# Patient Record
Sex: Female | Born: 1960 | Race: White | Hispanic: No | Marital: Married | Smoking: Former smoker
Health system: Southern US, Community
[De-identification: ages and names within clinical notes are randomized; demographics above are authoritative.]

## PROBLEM LIST (undated history)

## (undated) DIAGNOSIS — I10 Essential (primary) hypertension: Secondary | ICD-10-CM

## (undated) DIAGNOSIS — M8580 Other specified disorders of bone density and structure, unspecified site: Secondary | ICD-10-CM

## (undated) DIAGNOSIS — K219 Gastro-esophageal reflux disease without esophagitis: Secondary | ICD-10-CM

## (undated) HISTORY — PX: BREAST SURGERY: SHX581

## (undated) HISTORY — PX: DILATION AND CURETTAGE OF UTERUS: SHX78

## (undated) HISTORY — DX: Essential (primary) hypertension: I10

## (undated) HISTORY — DX: Gastro-esophageal reflux disease without esophagitis: K21.9

## (undated) HISTORY — DX: Other specified disorders of bone density and structure, unspecified site: M85.80

## (undated) HISTORY — PX: TUBAL LIGATION: SHX77

---

## 1998-12-05 ENCOUNTER — Other Ambulatory Visit: Admission: RE | Admit: 1998-12-05 | Discharge: 1998-12-05 | Payer: Self-pay | Admitting: Gynecology

## 1999-03-17 ENCOUNTER — Ambulatory Visit (HOSPITAL_COMMUNITY): Admission: RE | Admit: 1999-03-17 | Discharge: 1999-03-17 | Payer: Self-pay | Admitting: Gynecology

## 1999-12-13 ENCOUNTER — Other Ambulatory Visit: Admission: RE | Admit: 1999-12-13 | Discharge: 1999-12-13 | Payer: Self-pay | Admitting: Gynecology

## 2000-12-24 ENCOUNTER — Other Ambulatory Visit: Admission: RE | Admit: 2000-12-24 | Discharge: 2000-12-24 | Payer: Self-pay | Admitting: Gynecology

## 2002-04-08 ENCOUNTER — Other Ambulatory Visit: Admission: RE | Admit: 2002-04-08 | Discharge: 2002-04-08 | Payer: Self-pay | Admitting: Gynecology

## 2003-09-22 ENCOUNTER — Other Ambulatory Visit: Admission: RE | Admit: 2003-09-22 | Discharge: 2003-09-22 | Payer: Self-pay | Admitting: Gynecology

## 2003-10-18 ENCOUNTER — Ambulatory Visit (HOSPITAL_COMMUNITY): Admission: RE | Admit: 2003-10-18 | Discharge: 2003-10-18 | Payer: Self-pay | Admitting: *Deleted

## 2003-10-18 ENCOUNTER — Ambulatory Visit (HOSPITAL_BASED_OUTPATIENT_CLINIC_OR_DEPARTMENT_OTHER): Admission: RE | Admit: 2003-10-18 | Discharge: 2003-10-18 | Payer: Self-pay | Admitting: *Deleted

## 2003-10-18 ENCOUNTER — Encounter: Admission: RE | Admit: 2003-10-18 | Discharge: 2003-10-18 | Payer: Self-pay | Admitting: *Deleted

## 2003-10-18 ENCOUNTER — Encounter (INDEPENDENT_AMBULATORY_CARE_PROVIDER_SITE_OTHER): Payer: Self-pay | Admitting: *Deleted

## 2004-09-22 ENCOUNTER — Other Ambulatory Visit: Admission: RE | Admit: 2004-09-22 | Discharge: 2004-09-22 | Payer: Self-pay | Admitting: Gynecology

## 2005-05-23 ENCOUNTER — Emergency Department (HOSPITAL_COMMUNITY): Admission: EM | Admit: 2005-05-23 | Discharge: 2005-05-24 | Payer: Self-pay | Admitting: Emergency Medicine

## 2005-09-25 ENCOUNTER — Other Ambulatory Visit: Admission: RE | Admit: 2005-09-25 | Discharge: 2005-09-25 | Payer: Self-pay | Admitting: Gynecology

## 2006-10-01 ENCOUNTER — Other Ambulatory Visit: Admission: RE | Admit: 2006-10-01 | Discharge: 2006-10-01 | Payer: Self-pay | Admitting: Gynecology

## 2006-10-05 IMAGING — CR DG SHOULDER 2+V*L*
3 series · 3 of 3 positions shown · non-contrast
Comparison: None.   
 LEFT SHOULDER ? 3 VIEW:

CLINICAL DATA: Fall with left shoulder pain and injury.

[w shoulder ap internal left]
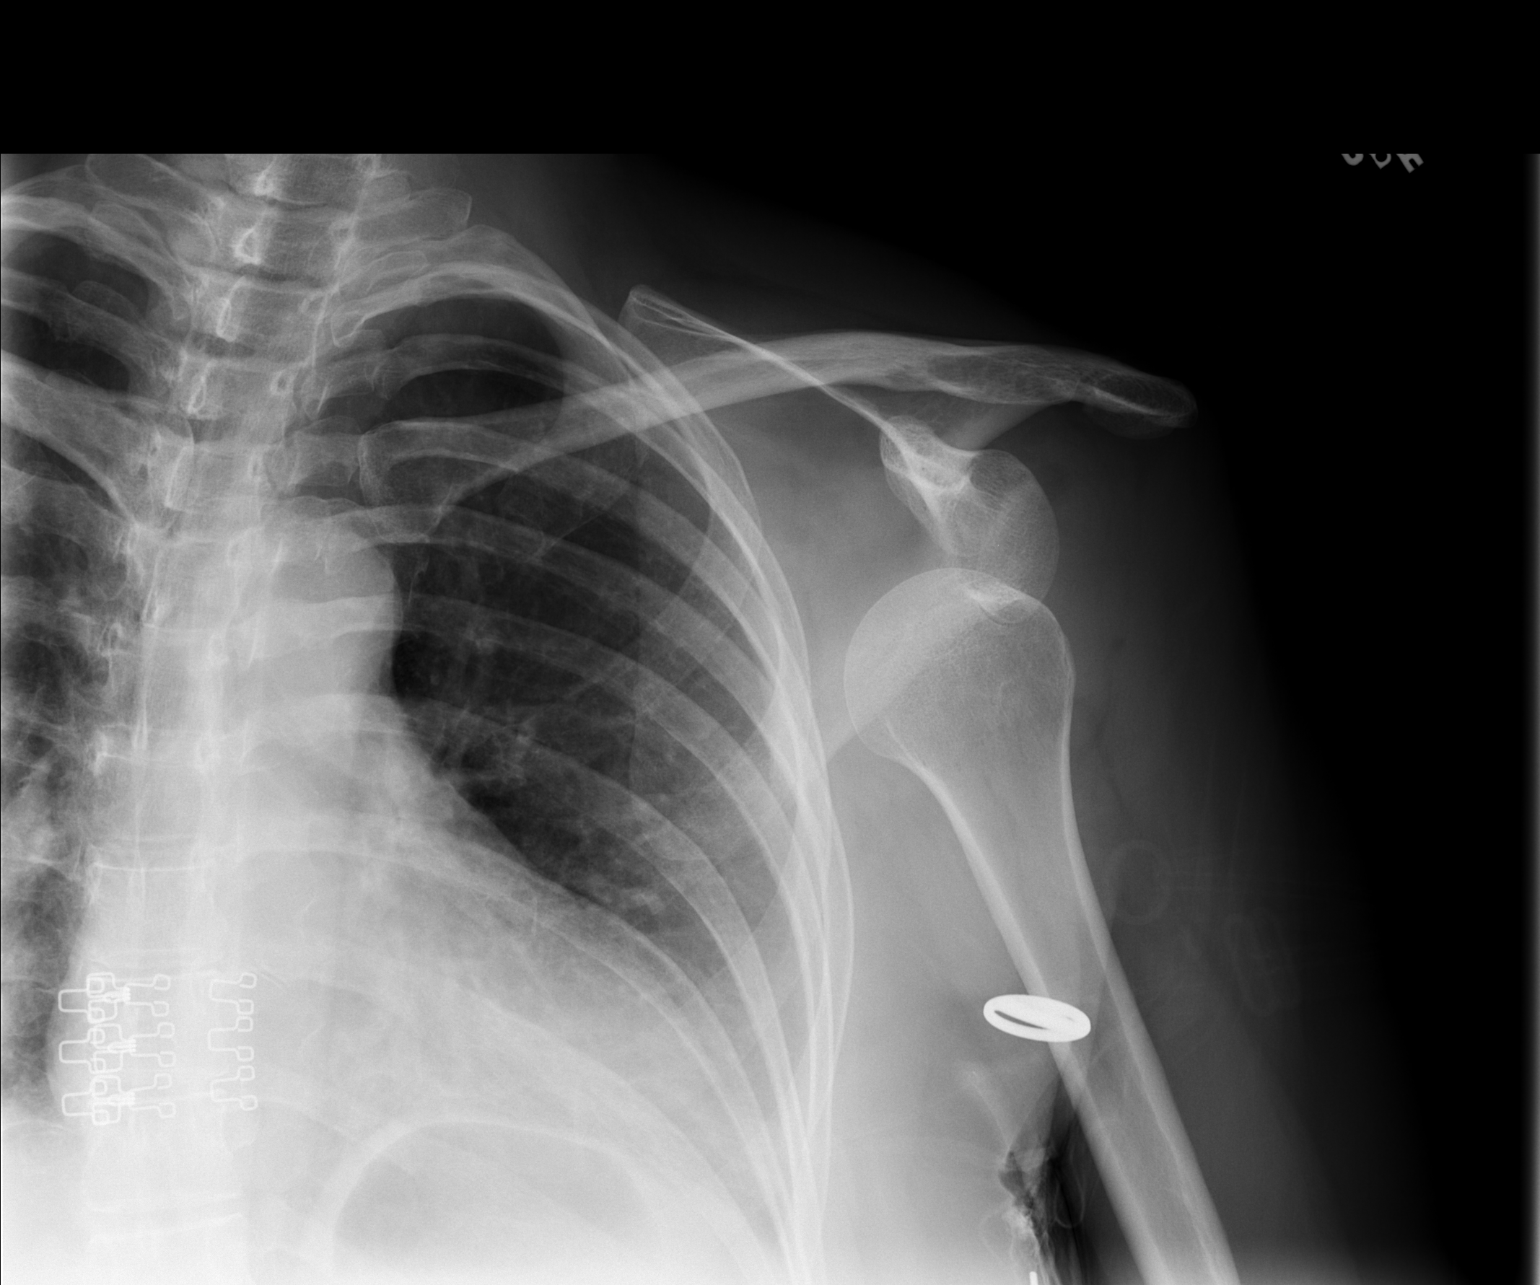

[w shoulder ap external left]
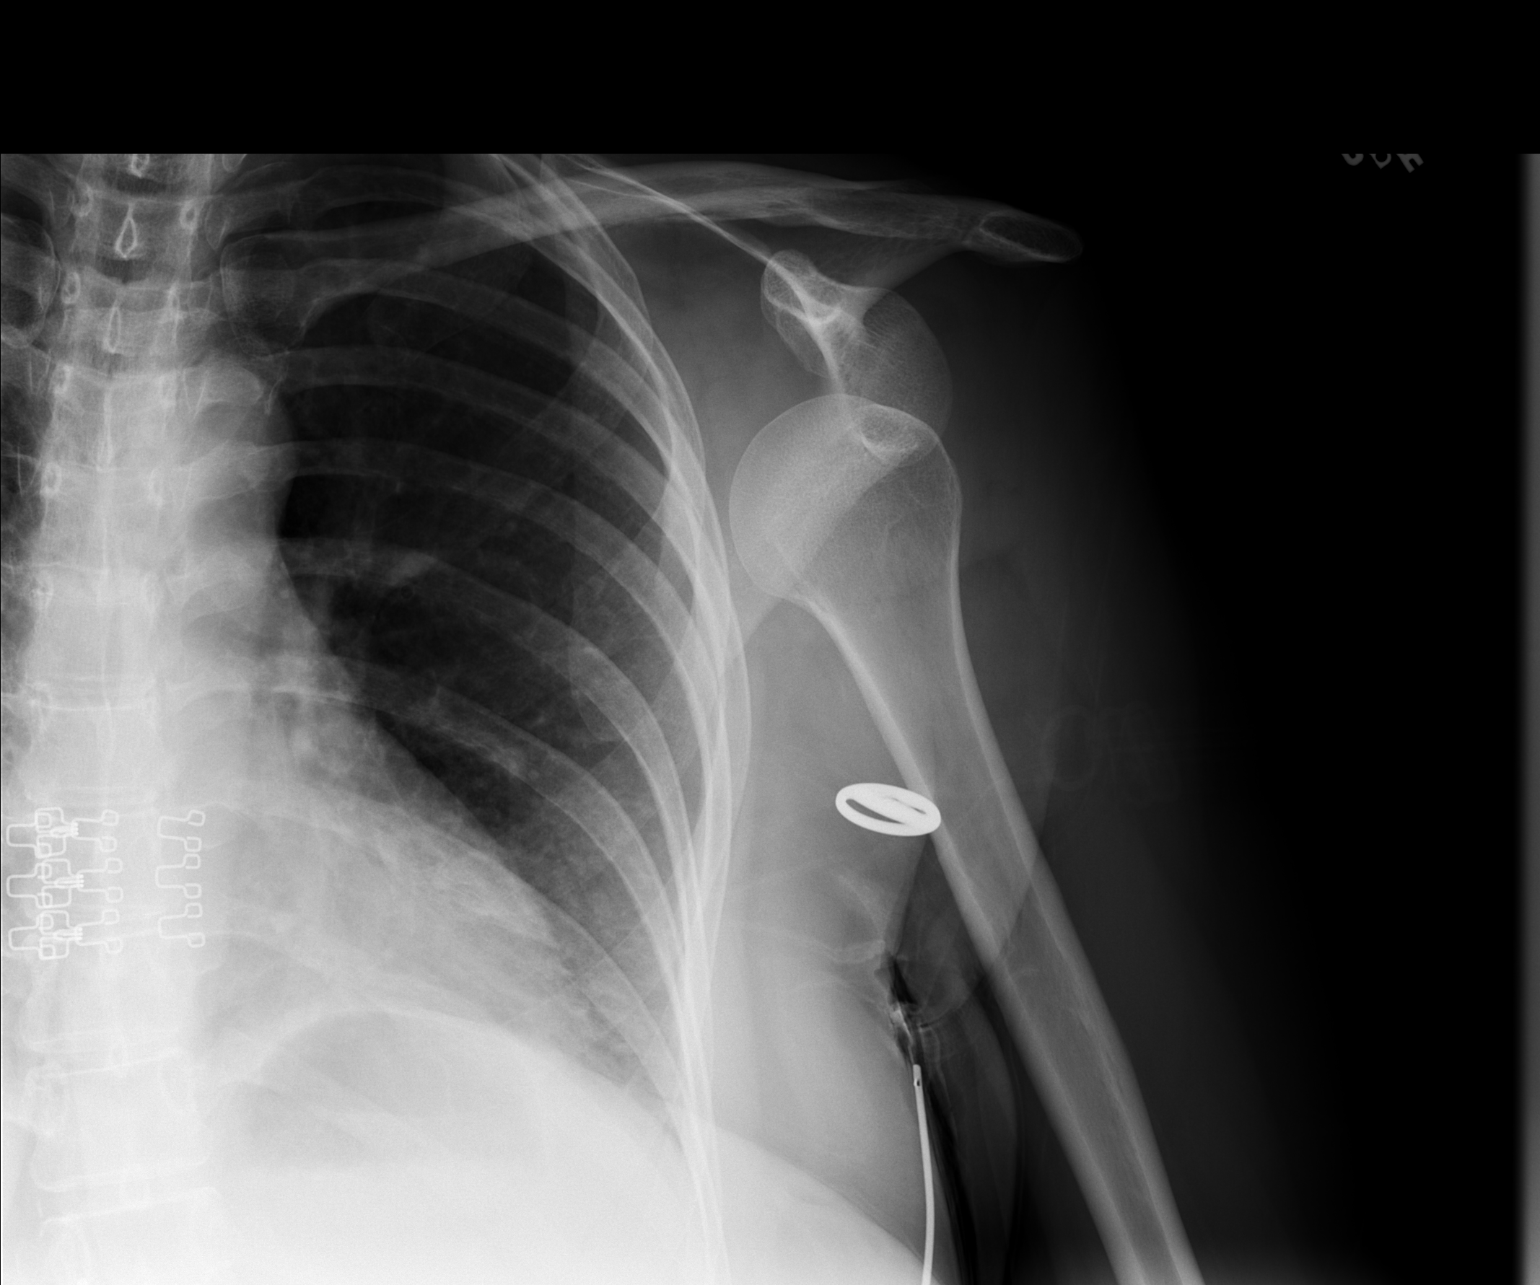

[w shoulder y view left]
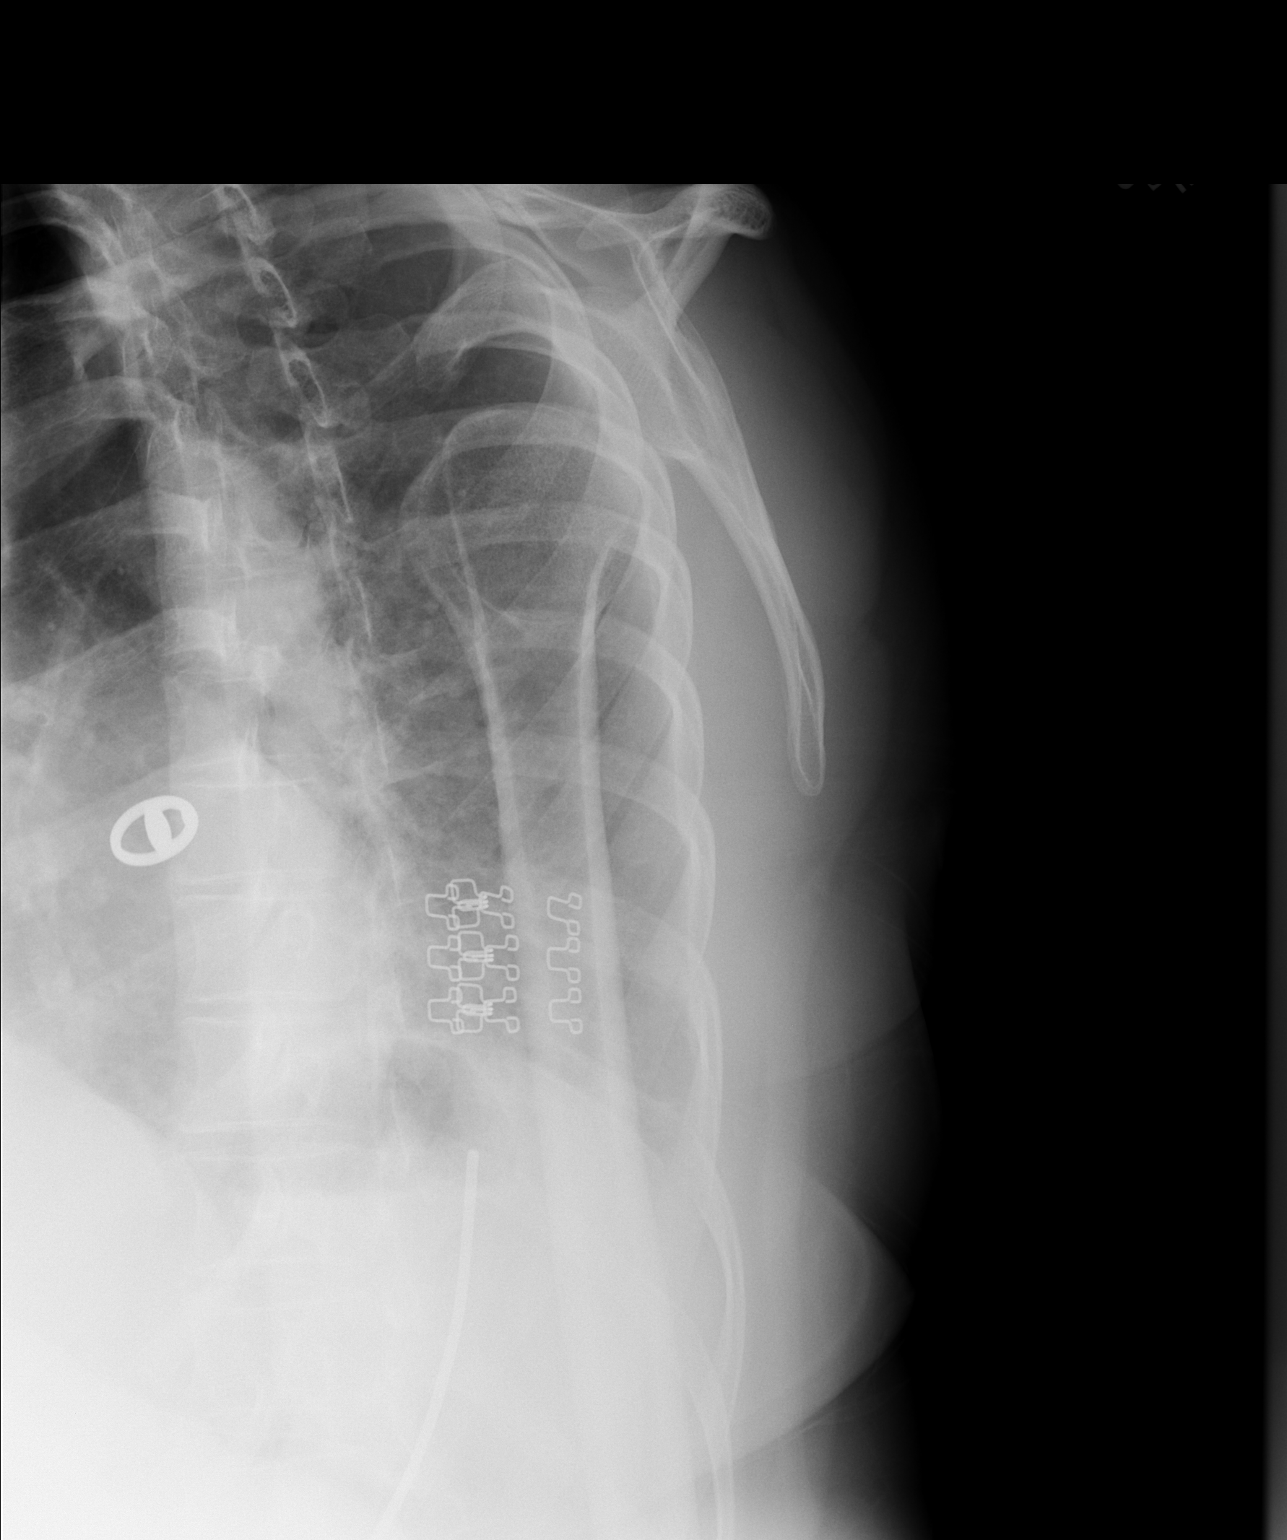

[3 of 3 positions shown; findings below may reference images not displayed]

FINDINGS: The humeral head is anteriorly dislocated.  No associated bony fracture is apparent.  No convincing evidence for [place] deformity is noted.
IMPRESSION: Anterior left humeral head dislocation.

## 2007-10-02 ENCOUNTER — Other Ambulatory Visit: Admission: RE | Admit: 2007-10-02 | Discharge: 2007-10-02 | Payer: Self-pay | Admitting: Gynecology

## 2008-10-18 ENCOUNTER — Ambulatory Visit: Payer: Self-pay | Admitting: Gynecology

## 2008-10-18 ENCOUNTER — Other Ambulatory Visit: Admission: RE | Admit: 2008-10-18 | Discharge: 2008-10-18 | Payer: Self-pay | Admitting: Gynecology

## 2008-10-18 ENCOUNTER — Encounter: Payer: Self-pay | Admitting: Gynecology

## 2008-10-26 ENCOUNTER — Ambulatory Visit: Payer: Self-pay | Admitting: Gynecology

## 2009-11-25 ENCOUNTER — Ambulatory Visit: Payer: Self-pay | Admitting: Gynecology

## 2009-11-25 ENCOUNTER — Other Ambulatory Visit: Admission: RE | Admit: 2009-11-25 | Discharge: 2009-11-25 | Payer: Self-pay | Admitting: Gynecology

## 2010-02-01 ENCOUNTER — Encounter: Admission: RE | Admit: 2010-02-01 | Discharge: 2010-02-01 | Payer: Self-pay | Admitting: Cardiology

## 2010-10-13 NOTE — Op Note (Signed)
NAME:  Alexis Juarez, FORSTER [facility]                        ACCOUNT NO.:  [id number]   MEDICAL RECORD NO.:  [id number]                   PATIENT TYPE:  AMB   LOCATION:  DSC                                  FACILITY:  [facility]   PHYSICIAN:  Vikki Ports, M.D.         DATE OF BIRTH:  1960/11/23   DATE OF PROCEDURE:  10/18/2003  DATE OF DISCHARGE:                                 OPERATIVE REPORT   PREOPERATIVE DIAGNOSIS:  Abnormal right mammogram.   POSTOPERATIVE DIAGNOSIS:  Abnormal right mammogram.   PROCEDURE:  Needle-localization right breast biopsy.   SURGEON:  Vikki Ports, M.D.   ANESTHESIA:  MAC.   DESCRIPTION OF PROCEDURE:  The patient was taken to the operating room and  placed in a supine position.  After adequate MAC anesthesia was induced, the  right breast was prepped and draped in normal sterile fashion.  Using 1%  lidocaine local anesthesia, the skin overlying the wire was anesthetized.  A  curvilinear incision was made, dissected down along the wire, ran toward the  tip, and took all the tissue surrounding it.  There was a small nodule  within this specimen.  Specimen mammography verified the presence of the  nodule.  Adequate hemostasis was assured and the skin was closed with  subcuticular 4-0 Monocryl, Steri-Strips, sterile dressing were applied.  The  patient tolerated the procedure well and went to PACU in good condition.                                               Vikki Ports, M.D.    KRH/MEDQ  D:  10/18/2003  T:  10/19/2003  Job:  [id number]

## 2011-03-16 ENCOUNTER — Encounter: Payer: Self-pay | Admitting: Gynecology

## 2011-03-22 ENCOUNTER — Ambulatory Visit (INDEPENDENT_AMBULATORY_CARE_PROVIDER_SITE_OTHER): Payer: BC Managed Care – PPO | Admitting: Gynecology

## 2011-03-22 ENCOUNTER — Encounter: Payer: Self-pay | Admitting: Gynecology

## 2011-03-22 VITALS — BP 124/88 | Ht 63.25 in | Wt 148.0 lb

## 2011-03-22 DIAGNOSIS — B373 Candidiasis of vulva and vagina: Secondary | ICD-10-CM

## 2011-03-22 DIAGNOSIS — B3731 Acute candidiasis of vulva and vagina: Secondary | ICD-10-CM

## 2011-03-22 DIAGNOSIS — M899 Disorder of bone, unspecified: Secondary | ICD-10-CM

## 2011-03-22 DIAGNOSIS — Z131 Encounter for screening for diabetes mellitus: Secondary | ICD-10-CM

## 2011-03-22 DIAGNOSIS — Z13220 Encounter for screening for lipoid disorders: Secondary | ICD-10-CM

## 2011-03-22 DIAGNOSIS — F419 Anxiety disorder, unspecified: Secondary | ICD-10-CM

## 2011-03-22 DIAGNOSIS — R823 Hemoglobinuria: Secondary | ICD-10-CM

## 2011-03-22 DIAGNOSIS — F411 Generalized anxiety disorder: Secondary | ICD-10-CM

## 2011-03-22 DIAGNOSIS — R300 Dysuria: Secondary | ICD-10-CM

## 2011-03-22 DIAGNOSIS — M8580 Other specified disorders of bone density and structure, unspecified site: Secondary | ICD-10-CM

## 2011-03-22 DIAGNOSIS — IMO0002 Reserved for concepts with insufficient information to code with codable children: Secondary | ICD-10-CM

## 2011-03-22 DIAGNOSIS — Z23 Encounter for immunization: Secondary | ICD-10-CM

## 2011-03-22 DIAGNOSIS — Z01419 Encounter for gynecological examination (general) (routine) without abnormal findings: Secondary | ICD-10-CM

## 2011-03-22 MED ORDER — ALPRAZOLAM 0.25 MG PO TABS: 0.2500 mg | ORAL_TABLET | Freq: Every evening | ORAL | Status: AC | PRN

## 2011-03-22 MED ORDER — FLUCONAZOLE 150 MG PO TABS: 150.0000 mg | ORAL_TABLET | Freq: Once | ORAL | Status: AC

## 2011-03-22 NOTE — Progress Notes (Signed)
Alexis Juarez 1961/03/06 [id number]        50 y.o.  for annual exam.  Overall doing well. She does note some discomfort with intercourse and some dysuria following intercourse but not in between. Having some hot flashes and sweats but these seem to be getting better. She is amenorrheic times several years.  History of osteopenia due for repeat bone density now.  Past medical history,surgical history, medications, allergies, family history and social history were all reviewed and documented in the EPIC chart. ROS:  Was performed and pertinent positives and negatives are included in the history.  Exam: chaperone present Filed Vitals:   03/22/11 1202  BP: 124/88   General appearance  Normal Skin grossly normal Head/Neck normal with no cervical or supraclavicular adenopathy thyroid normal Lungs  clear Cardiac RR, without RMG Abdominal  soft, nontender, without masses, organomegaly or hernia Breasts  examined lying and sitting without masses, retractions, discharge or axillary adenopathy. Pelvic  Ext/BUS/vagina  normal with mild atrophic changes noted, wet prep done  Cervix  normal    Uterus  anteverted, normal size, shape and contour, midline and mobile nontender   Adnexa  Without masses or tenderness    Anus and perineum  normal   Rectovaginal  normal sphincter tone without palpated masses or tenderness.    Assessment/Plan:  50 y.o. female for annual exam.    1. Dyspareunia, coital dysuria, wet prep positive for yeast. Discussed with patient we'll treat the yeast with Diflucan 150x1 dose. I think that her symptoms are probably also atrophy related. Options to include lubricants, moisturizers such as Replens, HRT including Vagifem were all reviewed. Patient also tried lubricants first see how she does and she'll follow up if there are continued issues. 2. Osteopenia. Patient has history of osteopenia with DEXA June 2010 showing a -2.0 at the AP spine. She'll repeat her DEXA now also  check a vitamin D level she'll follow up for these results. 3. Anxiety. Patient has some mild anxiety involving caring for her aging mother. She asked for refill of Xanax 0.25 #30 refill x1 the she uses when necessary. 4. Health maintenance. SBE monthly reviewed. Had mammogram this month which was normal. She will continue with annual mammography. Recommended screening colonoscopy she agrees to arrange this. She has no history of significant abnormal Pap smears with a number of normal in a row in her chart the last in 2011.  Current screening guidelines were reviewed and she agrees with less frequent screening interval. No Pap was done this year we'll plan every 3 years. Baseline labs to include CBC urinalysis glucose lipid profile were ordered. She continues to smoke despite attempts to help her [email or web address] elevated blood pressure noted with diastolic at 88 MS and have her rechecked in the exam situation she agrees to do so.    Dara Lords MD, 12:44 PM 03/22/2011

## 2011-03-23 ENCOUNTER — Other Ambulatory Visit: Payer: Self-pay | Admitting: *Deleted

## 2011-03-23 DIAGNOSIS — E7800 Pure hypercholesterolemia, unspecified: Secondary | ICD-10-CM

## 2011-03-23 LAB — VITAMIN D 25 HYDROXY (VIT D DEFICIENCY, FRACTURES): Vit D, 25-Hydroxy: 45 ng/mL (ref 30–89)

## 2011-03-23 MED ORDER — SULFAMETHOXAZOLE-TMP DS 800-160 MG PO TABS: 1.0000 | ORAL_TABLET | Freq: Two times a day (BID) | ORAL | Status: AC

## 2011-03-23 NOTE — Progress Notes (Signed)
Addended by: Dara Lords on: 03/23/2011 08:49 AM   Modules accepted: Orders

## 2011-04-12 ENCOUNTER — Other Ambulatory Visit (INDEPENDENT_AMBULATORY_CARE_PROVIDER_SITE_OTHER): Payer: BC Managed Care – PPO | Admitting: *Deleted

## 2011-04-12 ENCOUNTER — Telehealth: Payer: Self-pay | Admitting: Gynecology

## 2011-04-12 ENCOUNTER — Ambulatory Visit (INDEPENDENT_AMBULATORY_CARE_PROVIDER_SITE_OTHER): Payer: BC Managed Care – PPO | Admitting: *Deleted

## 2011-04-12 DIAGNOSIS — M899 Disorder of bone, unspecified: Secondary | ICD-10-CM

## 2011-04-12 DIAGNOSIS — M8580 Other specified disorders of bone density and structure, unspecified site: Secondary | ICD-10-CM

## 2011-04-12 DIAGNOSIS — M898X9 Other specified disorders of bone, unspecified site: Secondary | ICD-10-CM

## 2011-04-12 DIAGNOSIS — M948X9 Other specified disorders of cartilage, unspecified sites: Secondary | ICD-10-CM

## 2011-04-12 DIAGNOSIS — E7800 Pure hypercholesterolemia, unspecified: Secondary | ICD-10-CM

## 2011-04-12 NOTE — Progress Notes (Signed)
Pt informed with the below note, recall letter in computer. 

## 2011-04-12 NOTE — Telephone Encounter (Signed)
Tell patient that bone density does show some loss and I want to check a TSH, PTH and comprehensive metabolic panel. Her vitamin D level was good. Recommend office visit after blood work is done so we can discuss her bone density.

## 2011-04-16 NOTE — Telephone Encounter (Signed)
Pt informed with the below note. 

## 2011-04-16 NOTE — Telephone Encounter (Signed)
Lm for pt to call

## 2011-06-16 IMAGING — CR DG CHEST 2V
2 series · 2 of 2 positions shown · non-contrast
Comparison: None.

CLINICAL DATA: Chest pain

CHEST - 2 VIEW

[view not recorded (1 of 2)]
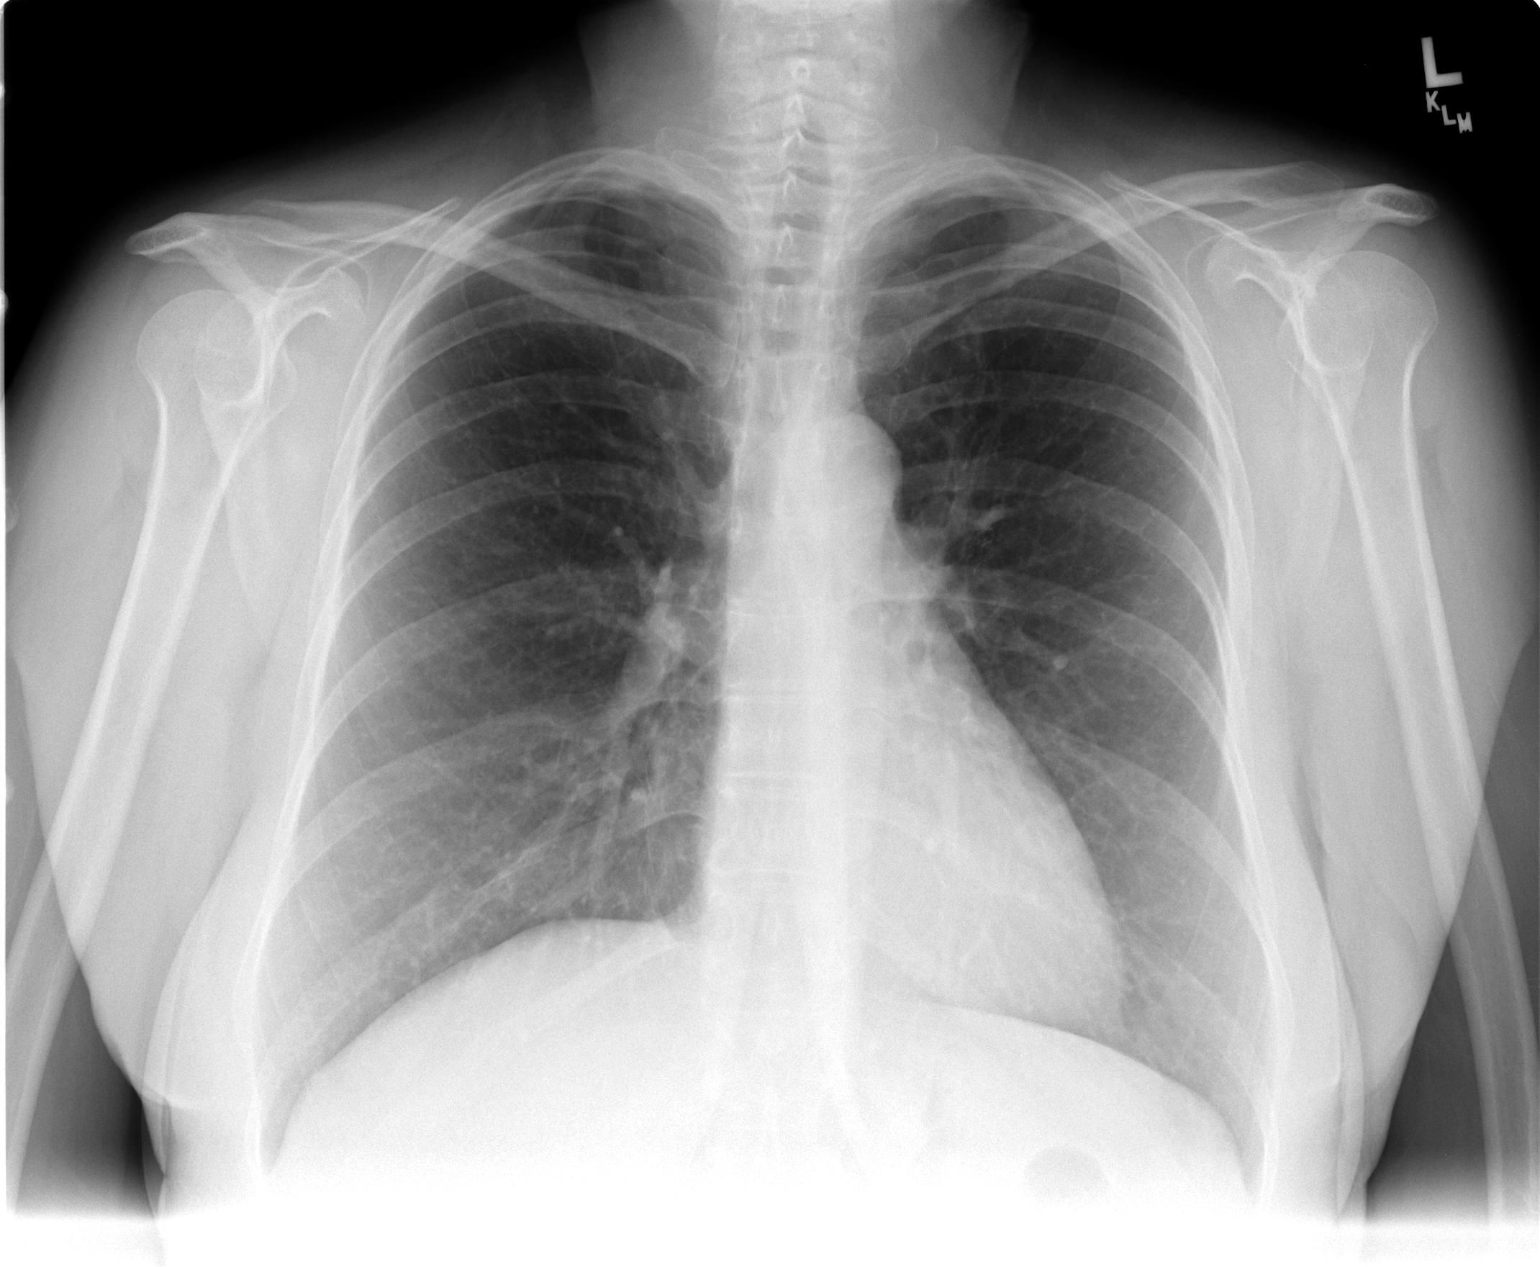

[view not recorded (2 of 2)]
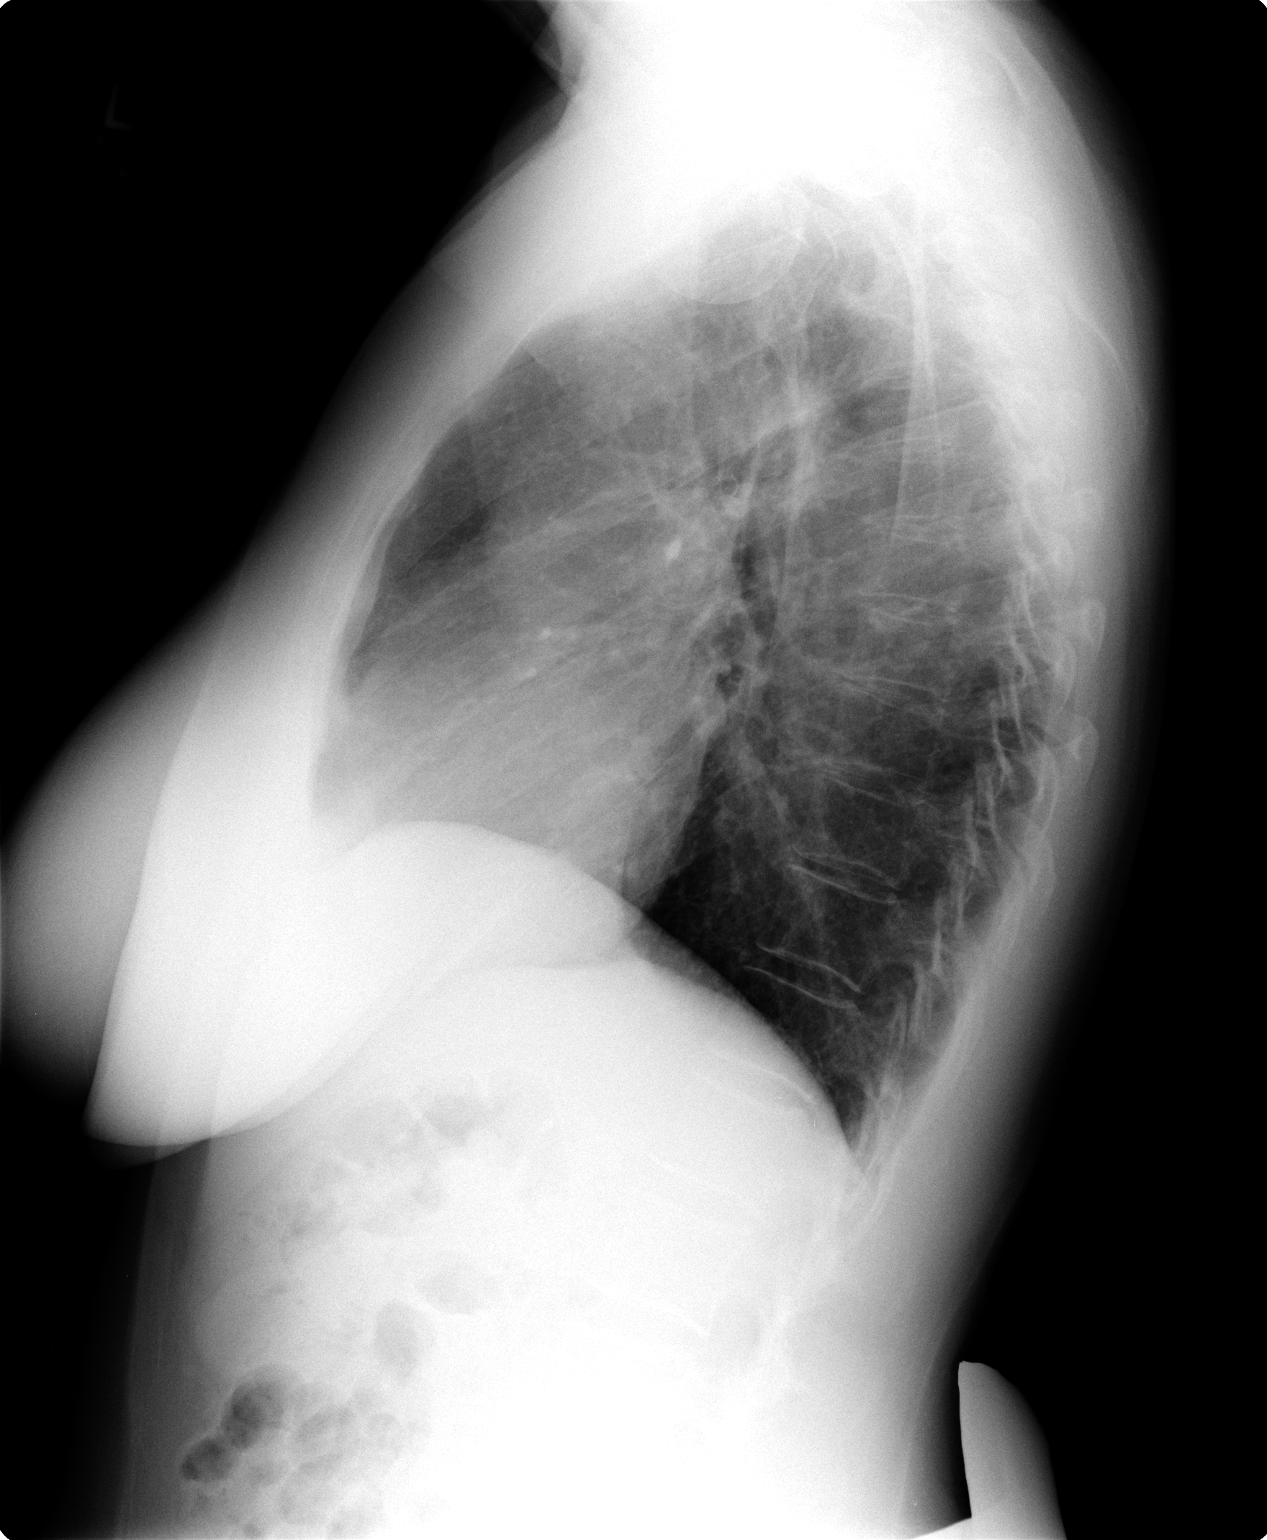

[2 of 2 positions shown; findings below may reference images not displayed]

FINDINGS: The lungs are clear without focal infiltrate, edema,
pneumothorax or pleural effusion. The cardiopericardial silhouette
is within normal limits for size. Imaged bony structures of the
thorax are intact.
IMPRESSION: Normal exam.

## 2011-09-04 ENCOUNTER — Other Ambulatory Visit: Payer: Self-pay | Admitting: *Deleted

## 2011-09-04 DIAGNOSIS — E789 Disorder of lipoprotein metabolism, unspecified: Secondary | ICD-10-CM

## 2012-04-18 ENCOUNTER — Ambulatory Visit (INDEPENDENT_AMBULATORY_CARE_PROVIDER_SITE_OTHER): Payer: BC Managed Care – PPO | Admitting: Gynecology

## 2012-04-18 ENCOUNTER — Encounter: Payer: Self-pay | Admitting: Gynecology

## 2012-04-18 VITALS — BP 120/72 | Ht 63.12 in | Wt 155.0 lb

## 2012-04-18 DIAGNOSIS — Z13220 Encounter for screening for lipoid disorders: Secondary | ICD-10-CM

## 2012-04-18 DIAGNOSIS — M8580 Other specified disorders of bone density and structure, unspecified site: Secondary | ICD-10-CM

## 2012-04-18 DIAGNOSIS — IMO0002 Reserved for concepts with insufficient information to code with codable children: Secondary | ICD-10-CM

## 2012-04-18 DIAGNOSIS — Z01419 Encounter for gynecological examination (general) (routine) without abnormal findings: Secondary | ICD-10-CM

## 2012-04-18 DIAGNOSIS — M899 Disorder of bone, unspecified: Secondary | ICD-10-CM

## 2012-04-18 DIAGNOSIS — N951 Menopausal and female climacteric states: Secondary | ICD-10-CM

## 2012-04-18 LAB — CBC WITH DIFFERENTIAL/PLATELET
Basophils Absolute: 0.1 10*3/uL (ref 0.0–0.1)
HCT: 42 % (ref 36.0–46.0)
Hemoglobin: 14.5 g/dL (ref 12.0–15.0)
Lymphocytes Relative: 36 % (ref 12–46)
Monocytes Absolute: 0.5 10*3/uL (ref 0.1–1.0)
Monocytes Relative: 7 % (ref 3–12)
Neutro Abs: 3.9 10*3/uL (ref 1.7–7.7)
RDW: 12.8 % (ref 11.5–15.5)
WBC: 7.7 10*3/uL (ref 4.0–10.5)

## 2012-04-18 LAB — COMPREHENSIVE METABOLIC PANEL
AST: 20 U/L (ref 0–37)
Albumin: 4.3 g/dL (ref 3.5–5.2)
BUN: 18 mg/dL (ref 6–23)
Calcium: 9.3 mg/dL (ref 8.4–10.5)
Chloride: 101 mEq/L (ref 96–112)
Potassium: 3.6 mEq/L (ref 3.5–5.3)
Sodium: 137 mEq/L (ref 135–145)
Total Protein: 6.4 g/dL (ref 6.0–8.3)

## 2012-04-18 LAB — LIPID PANEL: LDL Cholesterol: 155 mg/dL — ABNORMAL HIGH (ref 0–99)

## 2012-04-18 MED ORDER — ESTRADIOL 0.05 MG/24HR TD PTTW: 1.0000 | MEDICATED_PATCH | TRANSDERMAL | Status: DC

## 2012-04-18 MED ORDER — PROGESTERONE MICRONIZED 100 MG PO CAPS: 100.0000 mg | ORAL_CAPSULE | Freq: Every day | ORAL | Status: DC

## 2012-04-18 NOTE — Patient Instructions (Signed)
Call to Schedule your mammogram  Facilities in [place]: 1)  [facility], [address] Phone: [phone] 2)  [facility], [address] Phone: [phone] 3)  Dr. Bertrand at [facility]  [address] Phone: [phone]     Mammogram A mammogram is an X-ray test to find changes in a woman's breast. You should get a mammogram if:  You are 40 years of age or older  You have risk factors.   Your doctor recommends that you have one.  BEFORE THE TEST  Do not schedule the test the week before your period, especially if your breasts are sore during this time.  On the day of your mammogram:  Wash your breasts and armpits well. After washing, do not put on any deodorant or talcum powder on until after your test.   Eat and drink as you usually do.   Take your medicines as usual.   If you are diabetic and take insulin, make sure you:   Eat before coming for your test.   Take your insulin as usual.   If you cannot keep your appointment, call before the appointment to cancel. Schedule another appointment.  TEST  You will need to undress from the waist up. You will put on a hospital gown.   Your breast will be put on the mammogram machine, and it will press firmly on your breast with a piece of plastic called a compression paddle. This will make your breast flatter so that the machine can X-ray all parts of your breast.   Both breasts will be X-rayed. Each breast will be X-rayed from above and from the side. An X-ray might need to be taken again if the picture is not good enough.   The mammogram will last about 15 to 30 minutes.  AFTER THE TEST Finding out the results of your test Ask when your test results will be ready. Make sure you get your test results.  Document Released: 08/10/2008 Document Revised: 05/03/2011 Document Reviewed: 08/10/2008 ExitCare Patient  Information 2012 [employer].  Consider Stop Smoking.  Help is available at [facility]'s smoking cessation program @ www.[place].com or 336-832-0838. OR 1-800-QUIT-NOW (1-800-784-8669) for free smoking cessation counseling.  Smokefree.gov (http://www.smokefree.gov) provides free, accurate, evidence-based information and professional assistance to help support the immediate and long-term needs of people trying to quit smoking.    Smoking Hazards Smoking cigarettes is extremely bad for your health. Tobacco smoke has over 200 known poisons in it. There are over 60 chemicals in tobacco smoke that cause cancer. Some of the chemicals found in cigarette smoke include:  Cyanide.  Benzene.  Formaldehyde.  Methanol (wood alcohol).  Acetylene (fuel used in welding torches).  Ammonia.  Cigarette smoke also contains the poisonous gases nitrogen oxide and carbon monoxide.  Cigarette smokers have an increased risk of many serious medical problems, including: Lung cancer.  Lung disease (such as pneumonia, bronchitis, and emphysema).  Heart attack and chest pain due to the heart not getting enough oxygen (angina).  Heart disease and peripheral blood vessel disease.  Hypertension.  Stroke.  Oral cancer (cancer of the lip, mouth, or voice box).  Bladder cancer.  Pancreatic cancer.  Cervical cancer.  Pregnancy complications, including premature birth.  Low birthweight babies.  Early menopause.  Lower estrogen level for women.  Infertility.  Facial wrinkles.  Blindness.  Increased risk of broken bones (fractures).  Senile dementia.    Stillbirths and smaller newborn babies, birth defects, and genetic damage to sperm.  Stomach ulcers and internal bleeding.  Children of smokers have an increased risk of the following, because of secondhand smoke exposure:  Sudden infant death syndrome (SIDS).  Respiratory infections.  Lung cancer.  Heart disease.  Ear infections.  Smoking causes  approximately: 90% of all lung cancer deaths in men.  80% of all lung cancer deaths in women.  90% of deaths from chronic obstructive lung disease.  Compared with nonsmokers, smoking increases the risk of: Coronary heart disease by 2 to 4 times.  Stroke by 2 to 4 times.  Men developing lung cancer by 23 times.  Women developing lung cancer by 13 times.  Dying from chronic obstructive lung diseases by 12 times.  Someone who smokes 2 packs a day loses about 8 years of his or her life. Even smoking lightly shortens your life expectancy by several years. You can greatly reduce the risk of medical problems for you and your family by stopping now. Smoking is the most preventable cause of death and disease in our society. Within days of quitting smoking, your circulation returns to normal, you decrease the risk of having a heart attack, and your lung capacity improves. There may be some increased phlegm in the first few days after quitting, and it may take months for your lungs to clear up completely. Quitting for 10 years cuts your lung cancer risk to almost that of a nonsmoker. WHY IS SMOKING ADDICTIVE? Nicotine is the chemical agent in tobacco that is capable of causing addiction or dependence.  When you smoke and inhale, nicotine is absorbed rapidly into the bloodstream through your lungs. Nicotine absorbed through the lungs is capable of creating a powerful addiction. Both inhaled and non-inhaled nicotine may be addictive.  Addiction studies of cigarettes and spit tobacco show that addiction to nicotine occurs mainly during the teen years, when young people begin using tobacco products.  WHAT ARE THE BENEFITS OF QUITTING?  There are many health benefits to quitting smoking.  Likelihood of developing cancer and heart disease decreases. Health improvements are seen almost immediately.  Blood pressure, pulse rate, and breathing patterns start returning to normal soon after quitting.  People who quit  may see an improvement in their overall quality of life.  Some people choose to quit all at once. Other options include nicotine replacement products, such as patches, gum, and nasal sprays. Do not use these products without first checking with your caregiver. QUITTING SMOKING It is not easy to quit smoking. Nicotine is addicting, and longtime habits are hard to change. To start, you can write down all your reasons for quitting, tell your family and friends you want to quit, and ask for their help. Throw your cigarettes away, chew gum or cinnamon sticks, keep your hands busy, and drink extra water or juice. Go for walks and practice deep breathing to relax. Think of all the money you are saving: around $1,000 a year, for the average pack-a-day smoker. Nicotine patches and gum have been shown to improve success at efforts to stop smoking. Zyban (bupropion) is an anti-depressant drug that can be prescribed to reduce nicotine withdrawal symptoms and to suppress the urge to smoke. Smoking is an addiction with both physical and psychological effects. Joining a stop-smoking support group can help you cope with the emotional issues. For more information and advice on programs to stop smoking, call your doctor, your local hospital, or these organizations: American Lung Association -   1-800-LUNGUSA   Smoking Cessation  This document explains the best ways for you to quit smoking and new treatments to help. It lists new medicines that can double or triple your chances of quitting and quitting for good. It also considers ways to avoid relapses and concerns you may have about quitting, including weight gain. NICOTINE: A POWERFUL ADDICTION If you have tried to quit smoking, you know how hard it can be. It is hard because nicotine is a very addictive drug. For some people, it can be as addictive as heroin or cocaine. Usually, people make 2 or 3 tries, or more, before finally being able to quit. Each time you try to  quit, you can learn about what helps and what hurts. Quitting takes hard work and a lot of effort, but you can quit smoking. QUITTING SMOKING IS ONE OF THE MOST IMPORTANT THINGS YOU WILL EVER DO.  You will live longer, feel better, and live better.   The impact on your body of quitting smoking is felt almost immediately:   Within 20 minutes, blood pressure decreases. Pulse returns to its normal level.   After 8 hours, carbon monoxide levels in the blood return to normal. Oxygen level increases.   After 24 hours, chance of heart attack starts to decrease. Breath, hair, and body stop smelling like smoke.   After 48 hours, damaged nerve endings begin to recover. Sense of taste and smell improve.   After 72 hours, the body is virtually free of nicotine. Bronchial tubes relax and breathing becomes easier.   After 2 to 12 weeks, lungs can hold more air. Exercise becomes easier and circulation improves.   Quitting will reduce your risk of having a heart attack, stroke, cancer, or lung disease:   After 1 year, the risk of coronary heart disease is cut in half.   After 5 years, the risk of stroke falls to the same as a nonsmoker.   After 10 years, the risk of lung cancer is cut in half and the risk of other cancers decreases significantly.   After 15 years, the risk of coronary heart disease drops, usually to the level of a nonsmoker.   If you are pregnant, quitting smoking will improve your chances of having a healthy baby.   The people you live with, especially your children, will be healthier.   You will have extra money to spend on things other than cigarettes.  FIVE KEYS TO QUITTING Studies have shown that these 5 steps will help you quit smoking and quit for good. You have the best chances of quitting if you use them together: 1. Get ready.  2. Get support and encouragement.  3. Learn new skills and behaviors.  4. Get medicine to reduce your nicotine addiction and use it  correctly.  5. Be prepared for relapse or difficult situations. Be determined to continue trying to quit, even if you do not succeed at first.  1. GET READY  Set a quit date.   Change your environment.   Get rid of ALL cigarettes, ashtrays, matches, and lighters in your home, car, and place of work.   Do not let people smoke in your home.   Review your past attempts to quit. Think about what worked and what did not.   Once you quit, do not smoke. NOT EVEN A PUFF!  2. GET SUPPORT AND ENCOURAGEMENT Studies have shown that you have a better chance of being successful if you have help. You can get support   in many ways.  Tell your family, friends, and coworkers that you are going to quit and need their support. Ask them not to smoke around you.   Talk to your caregivers (doctor, dentist, nurse, pharmacist, psychologist, and/or smoking counselor).   Get individual, group, or telephone counseling and support. The more counseling you have, the better your chances are of quitting. Programs are available at local hospitals and health centers. Call your local health department for information about programs in your area.   Spiritual beliefs and practices may help some smokers quit.   Quit meters are small computer programs online or downloadable that keep track of quit statistics, such as amount of "quit-time," cigarettes not smoked, and money saved.   Many smokers find one or more of the many self-help books available useful in helping them quit and stay off tobacco.  3. LEARN NEW SKILLS AND BEHAVIORS  Try to distract yourself from urges to smoke. Talk to someone, go for a walk, or occupy your time with a task.   When you first try to quit, change your routine. Take a different route to work. Drink tea instead of coffee. Eat breakfast in a different place.   Do something to reduce your stress. Take a hot bath, exercise, or read a book.   Plan something enjoyable to do every day. Reward  yourself for not smoking.   Explore interactive web-based programs that specialize in helping you quit.  4. GET MEDICINE AND USE IT CORRECTLY Medicines can help you stop smoking and decrease the urge to smoke. Combining medicine with the above behavioral methods and support can quadruple your chances of successfully quitting smoking. The U[place]S. Food and Drug Administration (FDA) has approved 7 medicines to help you quit smoking. These medicines fall into 3 categories.  Nicotine replacement therapy (delivers nicotine to your body without the negative effects and risks of smoking):   Nicotine gum: Available over-the-counter.   Nicotine lozenges: Available over-the-counter.   Nicotine inhaler: Available by prescription.   Nicotine nasal spray: Available by prescription.   Nicotine skin patches (transdermal): Available by prescription and over-the-counter.   Antidepressant medicine (helps people abstain from smoking, but how this works is unknown):   Bupropion sustained-release (SR) tablets: Available by prescription.   Nicotinic receptor partial agonist (simulates the effect of nicotine in your brain):   Varenicline tartrate tablets: Available by prescription.   Ask your caregiver for advice about which medicines to use and how to use them. Carefully read the information on the package.   Everyone who is trying to quit may benefit from using a medicine. If you are pregnant or trying to become pregnant, nursing an infant, you are under age 18, or you smoke fewer than 10 cigarettes per day, talk to your caregiver before taking any nicotine replacement medicines.   You should stop using a nicotine replacement product and call your caregiver if you experience nausea, dizziness, weakness, vomiting, fast or irregular heartbeat, mouth problems with the lozenge or gum, or redness or swelling of the skin around the patch that does not go away.   Do not use any other product containing nicotine  while using a nicotine replacement product.   Talk to your caregiver before using these products if you have diabetes, heart disease, asthma, stomach ulcers, you had a recent heart attack, you have high blood pressure that is not controlled with medicine, a history of irregular heartbeat, or you have been prescribed medicine to help you quit smoking.  5.   BE PREPARED FOR RELAPSE OR DIFFICULT SITUATIONS  Most relapses occur within the first 3 months after quitting. Do not be discouraged if you start smoking again. Remember, most people try several times before they finally quit.   You may have symptoms of withdrawal because your body is used to nicotine. You may crave cigarettes, be irritable, feel very hungry, cough often, get headaches, or have difficulty concentrating.   The withdrawal symptoms are only temporary. They are strongest when you first quit, but they will go away within 10 to 14 days.  Here are some difficult situations to watch for:  Alcohol. Avoid drinking alcohol. Drinking lowers your chances of successfully quitting.   Caffeine. Try to reduce the amount of caffeine you consume. It also lowers your chances of successfully quitting.   Other smokers. Being around smoking can make you want to smoke. Avoid smokers.   Weight gain. Many smokers will gain weight when they quit, usually less than 10 pounds. Eat a healthy diet and stay active. Do not let weight gain distract you from your main goal, quitting smoking. Some medicines that help you quit smoking may also help delay weight gain. You can always lose the weight gained after you quit.   Bad mood or depression. There are a lot of ways to improve your mood other than smoking.  If you are having problems with any of these situations, talk to your caregiver. SPECIAL SITUATIONS AND CONDITIONS Studies suggest that everyone can quit smoking. Your situation or condition can give you a special reason to quit.  Pregnant women/new  mothers: By quitting, you protect your baby's health and your own.   Hospitalized patients: By quitting, you reduce health problems and help healing.   Heart attack patients: By quitting, you reduce your risk of a second heart attack.   Lung, head, and neck cancer patients: By quitting, you reduce your chance of a second cancer.   Parents of children and adolescents: By quitting, you protect your children from illnesses caused by secondhand smoke.  QUESTIONS TO THINK ABOUT Think about the following questions before you try to stop smoking. You may want to talk about your answers with your caregiver.  Why do you want to quit?   If you tried to quit in the past, what helped and what did not?   What will be the most difficult situations for you after you quit? How will you plan to handle them?   Who can help you through the tough times? Your family? Friends? Caregiver?   What pleasures do you get from smoking? What ways can you still get pleasure if you quit?  Here are some questions to ask your caregiver:  How can you help me to be successful at quitting?   What medicine do you think would be best for me and how should I take it?   What should I do if I need more help?   What is smoking withdrawal like? How can I get information on withdrawal?  Quitting takes hard work and a lot of effort, but you can quit smoking.   

## 2012-04-18 NOTE — Progress Notes (Signed)
Alexis Juarez [birth year] [id number]        51 y.o.  [id number] for annual exam.    Past medical history,surgical history, medications, allergies, family history and social history were all reviewed and documented in the EPIC chart. ROS:  Was performed and pertinent positives and negatives are included in the history.  Exam: Sherrilyn Rist assistant Filed Vitals:   04/18/12 1455  BP: 120/72  Height: 5' 3.12" (1.603 m)  Weight: 155 lb (70.308 kg)   General appearance  Normal Skin grossly normal Head/Neck normal with no cervical or supraclavicular adenopathy thyroid normal Lungs  clear Cardiac RR, without RMG Abdominal  soft, nontender, without masses, organomegaly or hernia Breasts  examined lying and sitting without masses, retractions, discharge or axillary adenopathy. Pelvic  Ext/BUS/vagina  normal with atrophic changes  Cervix  normal   Uterus  axial, normal size, shape and contour, midline and mobile nontender   Adnexa  Without masses or tenderness    Anus and perineum  normal   Rectovaginal  normal sphincter tone without palpated masses or tenderness.    Assessment/Plan:  51 y.o. [id number] female for annual exam.   1. Menopausal symptoms/atrophic vaginitis. Patient with hot flushes, night sweats, vaginal dryness and dyspareunia. We've discussed options in the past to include HRT. Patient also with osteopenia T score -2.1. Had premature menopause around age 53. Recommend consider HRT which will address many of her symptoms and the bone loss. She does smoke and the risks of HRT to include increased risk of stroke heart attack DVT reviewed. The risk of breast cancer also discussed. The ACOG/NAMS statements her lowest dose per shortest period of time also discussed. Transdermal benefits/first pass effect reviewed. After lengthy discussion she wants to try HRT and will start with minivelle 0.05 and Prometrium 100 mg nightly. Need to report any bleeding discussed. Patient will follow up if  continued symptoms or wants to rediscuss options. If she does well once initiation that she'll continue to monitor. 2. Osteopenia.  DEXA 2012 with T score -2.1. FRAX 4.4%/0.6%. History of premature menopause. Never followed up for bone blood work as recommended and follow up discussion.  Will check TSH PTH comprehensive metabolic panel and vitamin D level. Options for treatment with bisphosphate or other medication given the T score/younger age/smoking versus observation given FRAX numbers and above the initiation of HRT.  We both agree with initiation of HRT check above blood work and then repeat DEXA in 1-2 years. 3. Pap smear. No Pap smear done today. Last Pap smear 2011. No history of abnormal Pap smears previously. Will repeat next year at 3 year interval. 4. Mammography 2012. Need to repeat now stressed and patient has scheduled next month. SBE monthly reviewed. 5. Colonoscopy. Patient has not had yet and I recommended she schedule and she ignores my recommendation. 6. Stop smoking. I reviewed stop smoking strategies. The adverse affects as far as bone density and overall cardiovascular health discussed. Patient states that she will try. 7. Health maintenance.  CBC lipid profile urinalysis ordered with above blood work of PTH TSH vitamin D and comprehensive metabolic panel.  Follow up one year, sooner as needed    Dara Lords MD, 5:12 PM 04/18/2012

## 2012-04-19 LAB — URINALYSIS W MICROSCOPIC + REFLEX CULTURE
Bacteria, UA: NONE SEEN
Bilirubin Urine: NEGATIVE
Casts: NONE SEEN
Crystals: NONE SEEN
Glucose, UA: NEGATIVE mg/dL
Ketones, ur: NEGATIVE mg/dL
Nitrite: NEGATIVE
pH: 6 (ref 5.0–8.0)

## 2012-04-19 LAB — TSH: TSH: 0.682 u[IU]/mL (ref 0.350–4.500)

## 2012-04-19 LAB — VITAMIN D 25 HYDROXY (VIT D DEFICIENCY, FRACTURES): Vit D, 25-Hydroxy: 41 ng/mL (ref 30–89)

## 2012-04-22 ENCOUNTER — Telehealth: Payer: Self-pay | Admitting: Gynecology

## 2012-04-22 LAB — URINE CULTURE

## 2012-04-22 MED ORDER — SULFAMETHOXAZOLE-TRIMETHOPRIM 800-160 MG PO TABS: 1.0000 | ORAL_TABLET | Freq: Two times a day (BID) | ORAL | Status: DC

## 2012-04-22 NOTE — Telephone Encounter (Signed)
Tell patient: 1. Urine shows blood but also bacteria consistent with UTI. Recommend treatment with Septra DS one by mouth twice a day x7 days. Recheck clean catch urinalysis in several weeks after treatment just to make sure the blood clears. 2. Cholesterol 272 LDL 155 triglycerides 286 all of which are very high. If this was not a fasting recommend repeat fasting lipid profile. If it was she needs to follow up with primary physician to consider treatment.

## 2012-04-23 ENCOUNTER — Other Ambulatory Visit: Payer: Self-pay | Admitting: Gynecology

## 2012-04-23 DIAGNOSIS — N390 Urinary tract infection, site not specified: Secondary | ICD-10-CM

## 2012-04-23 DIAGNOSIS — R3129 Other microscopic hematuria: Secondary | ICD-10-CM

## 2012-04-23 NOTE — Telephone Encounter (Signed)
Refills called into pharmacy

## 2012-04-23 NOTE — Telephone Encounter (Signed)
Cell phone with no voice mail set up.  Work # man said she has not worked there for 2-3 years. Left message home phone voice mail.

## 2012-04-23 NOTE — Telephone Encounter (Signed)
Patient informed. 

## 2012-05-08 ENCOUNTER — Encounter: Payer: Self-pay | Admitting: Gynecology

## 2012-05-23 ENCOUNTER — Other Ambulatory Visit: Payer: BC Managed Care – PPO

## 2012-05-23 DIAGNOSIS — N390 Urinary tract infection, site not specified: Secondary | ICD-10-CM

## 2012-05-23 DIAGNOSIS — R3129 Other microscopic hematuria: Secondary | ICD-10-CM

## 2012-05-24 LAB — URINALYSIS W MICROSCOPIC + REFLEX CULTURE
Bacteria, UA: NONE SEEN
Casts: NONE SEEN
Glucose, UA: NEGATIVE mg/dL
Ketones, ur: NEGATIVE mg/dL
Protein, ur: NEGATIVE mg/dL
pH: 5.5 (ref 5.0–8.0)

## 2013-05-12 ENCOUNTER — Other Ambulatory Visit: Payer: Self-pay | Admitting: Gynecology

## 2013-05-18 ENCOUNTER — Other Ambulatory Visit: Payer: Self-pay | Admitting: Gynecology

## 2013-06-11 ENCOUNTER — Encounter: Payer: Self-pay | Admitting: Gynecology

## 2013-06-18 ENCOUNTER — Encounter: Payer: Self-pay | Admitting: Gynecology

## 2013-07-09 ENCOUNTER — Ambulatory Visit (INDEPENDENT_AMBULATORY_CARE_PROVIDER_SITE_OTHER): Payer: BC Managed Care – PPO | Admitting: Gynecology

## 2013-07-09 ENCOUNTER — Other Ambulatory Visit (HOSPITAL_COMMUNITY)
Admission: RE | Admit: 2013-07-09 | Discharge: 2013-07-09 | Disposition: A | Discharge status: Home / Self Care | Payer: BC Managed Care – PPO | Source: Ambulatory Visit | Attending: Gynecology | Admitting: Gynecology

## 2013-07-09 ENCOUNTER — Encounter: Payer: Self-pay | Admitting: Gynecology

## 2013-07-09 VITALS — BP 120/76 | Ht 64.0 in | Wt 147.0 lb

## 2013-07-09 DIAGNOSIS — Z79890 Hormone replacement therapy (postmenopausal): Secondary | ICD-10-CM

## 2013-07-09 DIAGNOSIS — Z1151 Encounter for screening for human papillomavirus (HPV): Secondary | ICD-10-CM | POA: Insufficient documentation

## 2013-07-09 DIAGNOSIS — Z01419 Encounter for gynecological examination (general) (routine) without abnormal findings: Secondary | ICD-10-CM | POA: Insufficient documentation

## 2013-07-09 DIAGNOSIS — G4700 Insomnia, unspecified: Secondary | ICD-10-CM

## 2013-07-09 DIAGNOSIS — M899 Disorder of bone, unspecified: Secondary | ICD-10-CM

## 2013-07-09 DIAGNOSIS — M8580 Other specified disorders of bone density and structure, unspecified site: Secondary | ICD-10-CM

## 2013-07-09 DIAGNOSIS — M949 Disorder of cartilage, unspecified: Secondary | ICD-10-CM

## 2013-07-09 LAB — CBC WITH DIFFERENTIAL/PLATELET
BASOS PCT: 1 % (ref 0–1)
Basophils Absolute: 0.1 10*3/uL (ref 0.0–0.1)
Eosinophils Absolute: 0.5 10*3/uL (ref 0.0–0.7)
Eosinophils Relative: 8 % — ABNORMAL HIGH (ref 0–5)
HEMATOCRIT: 42.3 % (ref 36.0–46.0)
Hemoglobin: 14.2 g/dL (ref 12.0–15.0)
LYMPHS ABS: 2.2 10*3/uL (ref 0.7–4.0)
Lymphocytes Relative: 34 % (ref 12–46)
MCH: 31.4 pg (ref 26.0–34.0)
MCHC: 33.6 g/dL (ref 30.0–36.0)
MCV: 93.6 fL (ref 78.0–100.0)
MONO ABS: 0.4 10*3/uL (ref 0.1–1.0)
MONOS PCT: 7 % (ref 3–12)
NEUTROS ABS: 3.2 10*3/uL (ref 1.7–7.7)
Neutrophils Relative %: 50 % (ref 43–77)
Platelets: 227 10*3/uL (ref 150–400)
RBC: 4.52 MIL/uL (ref 3.87–5.11)
RDW: 12.9 % (ref 11.5–15.5)
WBC: 6.4 10*3/uL (ref 4.0–10.5)

## 2013-07-09 LAB — COMPREHENSIVE METABOLIC PANEL
ALT: 17 U/L (ref 0–35)
AST: 18 U/L (ref 0–37)
Albumin: 4.2 g/dL (ref 3.5–5.2)
Alkaline Phosphatase: 42 U/L (ref 39–117)
BUN: 13 mg/dL (ref 6–23)
CALCIUM: 8.9 mg/dL (ref 8.4–10.5)
CO2: 28 mEq/L (ref 19–32)
Chloride: 103 mEq/L (ref 96–112)
Creat: 0.75 mg/dL (ref 0.50–1.10)
GLUCOSE: 84 mg/dL (ref 70–99)
Potassium: 3.8 mEq/L (ref 3.5–5.3)
Sodium: 139 mEq/L (ref 135–145)
TOTAL PROTEIN: 6.4 g/dL (ref 6.0–8.3)
Total Bilirubin: 0.7 mg/dL (ref 0.2–1.2)

## 2013-07-09 LAB — LIPID PANEL
CHOLESTEROL: 213 mg/dL — AB (ref 0–200)
HDL: 60 mg/dL (ref >39)
LDL Cholesterol: 119 mg/dL — ABNORMAL HIGH (ref 0–99)
TRIGLYCERIDES: 169 mg/dL — AB (ref <150)
Total CHOL/HDL Ratio: 3.6 Ratio
VLDL: 34 mg/dL (ref 0–40)

## 2013-07-09 LAB — TSH: TSH: 0.78 u[IU]/mL (ref 0.350–4.500)

## 2013-07-09 MED ORDER — ALPRAZOLAM 0.25 MG PO TABS: ORAL_TABLET | ORAL | Status: DC

## 2013-07-09 MED ORDER — ESTRADIOL 0.05 MG/24HR TD PTTW: 1.0000 | MEDICATED_PATCH | TRANSDERMAL | Status: DC

## 2013-07-09 MED ORDER — PROGESTERONE MICRONIZED 100 MG PO CAPS: ORAL_CAPSULE | ORAL | Status: DC

## 2013-07-09 NOTE — Patient Instructions (Signed)
Followup for bone density as scheduled. Schedule colonoscopy with [facility] at [phone] or [facility] at [phone] Follow up in one year for annual exam 

## 2013-07-09 NOTE — Progress Notes (Addendum)
Alexis HesselbachMary Beth Juarez March 18, 1961 [id number]        53 y.o.  [id number] for annual exam.  Several issues noted below.  Past medical history,surgical history, problem list, medications, allergies, family history and social history were all reviewed and documented in the EPIC chart.  ROS:  Performed and pertinent positives and negatives are included in the history, assessment and plan .  Exam: Alexis Juarez Filed Vitals:   07/09/13 1024  BP: 120/76  Height: 5\' 4"  (1.626 m)  Weight: 147 lb (66.679 kg)   General appearance  Normal Skin grossly normal Head/Neck normal with no cervical or supraclavicular adenopathy thyroid normal Lungs  clear Cardiac RR, without RMG Abdominal  soft, nontender, without masses, organomegaly or hernia Breasts  examined lying and sitting without masses, retractions, discharge or axillary adenopathy. Pelvic  Ext/BUS/vagina  Normal with mild atrophic changes  Cervix  Normal. Pap/HPV  Uterus  anteverted, normal size, shape and contour, midline and mobile nontender   Adnexa  Without masses or tenderness    Anus and perineum  Normal   Rectovaginal  Normal sphincter tone without palpated masses or tenderness.    Assessment/Plan:  53 y.o. [id number] female for annual exam.   1. Postmenopausal/HRT. Patient initiated HRT last year for hot flushes, night sweats, vaginal dryness and dyspareunia with Minivelle 0.05 patches and Prometrium 100 mg nightly. Has done well and she wants to continue. We rediscussed the risks as outlined in my previous note and she is comfortable continuing. I refilled both x1 year. No vaginal bleeding and patient knows to report any vaginal bleeding. 2. Osteopenia.  DEXA 03/2011 T score -2.1 FRAX 4.4%/0.6%. Repeat DEXA now at a two-year interval. Increased calcium vitamin D reviewed. Check vitamin D level today. 3. Pap smear 2011. Pap/HPV today. No history of significant abnormal Pap smears previously. Repeat in 3-5 year interval assuming  normal. 4. Mammography 05/2013. Had followup studies to clear an area with recommended repeat one year. SBE monthly reviewed. 5. Insomnia. Patient has occasional insomnia for which she uses Xanax 0.25 mg tablets. #30 with 2 refills provided. 6. Colonoscopy never. Recommended patient schedule baseline and names and numbers provided. She agrees to do so. 7. Stop smoking discussed and encouraged. 8. Health maintenance. History of hypercholesterolemia. Never followed up for repeat fasting lipid profile or with her primary physician as recommended previously. Will recheck CBC comprehensive metabolic panel lipid profile urinalysis TSH vitamin D today. The need to followup with her primary follow her lipids was discussed. Followup for DEXA otherwise one year for annual exam.   Note: This document was prepared with digital dictation and possible smart phrase technology. Any transcriptional errors that result from this process are unintentional.   Alexis Juarez,Malaiah Viramontes P MD, 10:50 AM 07/09/2013

## 2013-07-09 NOTE — Addendum Note (Signed)
Addended by: Dayna BarkerGARDNER, Virgie Kunda K on: 07/09/2013 11:06 AM   Modules accepted: Orders

## 2013-07-10 LAB — URINALYSIS W MICROSCOPIC + REFLEX CULTURE
BACTERIA UA: NONE SEEN
BILIRUBIN URINE: NEGATIVE
CASTS: NONE SEEN
GLUCOSE, UA: NEGATIVE mg/dL
KETONES UR: NEGATIVE mg/dL
Leukocytes, UA: NEGATIVE
NITRITE: NEGATIVE
PH: 7.5 (ref 5.0–8.0)
Protein, ur: NEGATIVE mg/dL
Specific Gravity, Urine: 1.005 (ref 1.005–1.030)
Urobilinogen, UA: 0.2 mg/dL (ref 0.0–1.0)

## 2013-07-10 LAB — VITAMIN D 25 HYDROXY (VIT D DEFICIENCY, FRACTURES): Vit D, 25-Hydroxy: 47 ng/mL (ref 30–89)

## 2014-03-29 ENCOUNTER — Encounter: Payer: Self-pay | Admitting: Gynecology

## 2014-06-16 ENCOUNTER — Encounter: Payer: Self-pay | Admitting: Gynecology

## 2014-07-20 ENCOUNTER — Other Ambulatory Visit: Payer: Self-pay | Admitting: Gynecology

## 2014-07-28 ENCOUNTER — Encounter: Payer: Self-pay | Admitting: Gynecology

## 2014-09-13 ENCOUNTER — Other Ambulatory Visit: Payer: Self-pay | Admitting: Gynecology

## 2014-12-10 ENCOUNTER — Other Ambulatory Visit: Payer: Self-pay | Admitting: Gynecology

## 2014-12-10 ENCOUNTER — Telehealth: Payer: Self-pay

## 2014-12-10 MED ORDER — SULFAMETHOXAZOLE-TRIMETHOPRIM 800-160 MG PO TABS: 1.0000 | ORAL_TABLET | Freq: Two times a day (BID) | ORAL | Status: DC

## 2014-12-10 NOTE — Telephone Encounter (Signed)
Okay for Septra DS 1 by mouth twice a day 3 days. Patient is overdue for annual exam. Have her schedule that as we will not provide any further prescriptions until she follows up for that.

## 2014-12-10 NOTE — Telephone Encounter (Signed)
Patient informed. Rx sent. She said she was at work and could not schedule CE now but promised to.

## 2014-12-10 NOTE — Telephone Encounter (Signed)
Front desk transferred her to triage. Patient has UTI sx. Is familiar with UTI sx as has had in the past. She has frequency of urination, pain and burning with urination.  What to rec?

## 2015-02-04 ENCOUNTER — Ambulatory Visit (INDEPENDENT_AMBULATORY_CARE_PROVIDER_SITE_OTHER): Payer: BLUE CROSS/BLUE SHIELD | Admitting: Gynecology

## 2015-02-04 ENCOUNTER — Encounter: Payer: Self-pay | Admitting: Gynecology

## 2015-02-04 VITALS — BP 128/80 | Ht 64.0 in | Wt 142.0 lb

## 2015-02-04 DIAGNOSIS — Z23 Encounter for immunization: Secondary | ICD-10-CM

## 2015-02-04 DIAGNOSIS — M8580 Other specified disorders of bone density and structure, unspecified site: Secondary | ICD-10-CM | POA: Diagnosis not present

## 2015-02-04 DIAGNOSIS — Z01419 Encounter for gynecological examination (general) (routine) without abnormal findings: Secondary | ICD-10-CM | POA: Diagnosis not present

## 2015-02-04 LAB — CBC WITH DIFFERENTIAL/PLATELET
BASOS ABS: 0.1 10*3/uL (ref 0.0–0.1)
Basophils Relative: 1 % (ref 0–1)
Eosinophils Absolute: 0.4 10*3/uL (ref 0.0–0.7)
Eosinophils Relative: 7 % — ABNORMAL HIGH (ref 0–5)
HCT: 42.4 % (ref 36.0–46.0)
Hemoglobin: 14.1 g/dL (ref 12.0–15.0)
LYMPHS ABS: 1.9 10*3/uL (ref 0.7–4.0)
Lymphocytes Relative: 35 % (ref 12–46)
MCH: 31.7 pg (ref 26.0–34.0)
MCHC: 33.3 g/dL (ref 30.0–36.0)
MCV: 95.3 fL (ref 78.0–100.0)
MPV: 11.9 fL (ref 8.6–12.4)
Monocytes Absolute: 0.4 10*3/uL (ref 0.1–1.0)
Monocytes Relative: 8 % (ref 3–12)
Neutro Abs: 2.7 10*3/uL (ref 1.7–7.7)
Neutrophils Relative %: 49 % (ref 43–77)
Platelets: 211 10*3/uL (ref 150–400)
RBC: 4.45 MIL/uL (ref 3.87–5.11)
RDW: 13.4 % (ref 11.5–15.5)
WBC: 5.5 10*3/uL (ref 4.0–10.5)

## 2015-02-04 MED ORDER — ESTRADIOL 0.05 MG/24HR TD PTTW: 1.0000 | MEDICATED_PATCH | TRANSDERMAL | Status: DC

## 2015-02-04 MED ORDER — ALPRAZOLAM 0.25 MG PO TABS: ORAL_TABLET | ORAL | Status: DC

## 2015-02-04 MED ORDER — PROGESTERONE MICRONIZED 100 MG PO CAPS: ORAL_CAPSULE | ORAL | Status: DC

## 2015-02-04 MED ORDER — BUPROPION HCL ER (XL) 150 MG PO TB24
150.0000 mg | ORAL_TABLET | Freq: Every day | ORAL | Status: DC
Start: 2015-02-04 — End: 2015-03-07

## 2015-02-04 NOTE — Addendum Note (Signed)
Addended by: Kem Parkinson on: 02/04/2015 10:17 AM   Modules accepted: Orders

## 2015-02-04 NOTE — Progress Notes (Signed)
Alexis Juarez September 01, 1960 [id number]        54 y.o.  [name] for annual exam.  Several issues noted below.  Past medical history,surgical history, problem list, medications, allergies, family history and social history were all reviewed and documented as reviewed in the EPIC chart.  ROS:  Performed with pertinent positives and negatives included in the history, assessment and plan.   Additional significant findings :  none   Exam: [name] Vitals:   02/04/15 0916  BP: 128/80  Height:  (1.626 m)  Weight: 142 lb (64.411 kg)   General appearance:  Normal affect, orientation and appearance. Skin: Grossly normal HEENT: Without gross lesions.  No cervical or supraclavicular adenopathy. Thyroid normal.  Lungs:  Clear without wheezing, rales or rhonchi Cardiac: RR, without RMG Abdominal:  Soft, nontender, without masses, guarding, rebound, organomegaly or hernia Breasts:  Examined lying and sitting without masses, retractions, discharge or axillary adenopathy. Pelvic:  Ext/BUS/vagina normal  Cervix normal  Uterus anteverted, normal size, shape and contour, midline and mobile nontender   Adnexa  Without masses or tenderness    Anus and perineum  Normal   Rectovaginal  Normal sphincter tone without palpated masses or tenderness.    Assessment/Plan:  54 y.o. [id number] female for annual exam.   1. Postmenopausal/HRT. Patient ran out of her patches and has been taking the Prometrium only. Notes worsening hot flashes. I again reviewed the whole issue of HRT to include the WHI study with increased risk of stroke heart attack DVT and breast cancer. Patient wants to continue on HRT for now. Vivelle 0.05 mg patch and Prometrium 100 mg nightly prescribed. Call if any vaginal bleeding. 2. Depression/anxiety. Lost her mother this past summer and having issues with insomnia anxiety and crying spells. Requests possible trial of medication. No suicide ideation. Discussed various  options and will go ahead and start Wellbutrin XL 150 mg daily. Refill 1 year. ASAP call precautions to include worsening oppression/suicide ideation discussed. 3. Osteopenia. DEXA 2012 T score -2.1 FRAX 4%/0.6%. Never followed up for DEXA as recommended last year. I stressed the importance to do so and she agrees to schedule this year. Check vitamin D level today. 4. Pap smear/HPV negative 2015. No Pap smear done today. No history of significant abnormal Pap smears previously. Plan repeat Pap smear in 3-5 year interval. 5. Mammography 05/2014. Continue with annual mammography when due. SBE monthly reviewed. 6. Colonoscopy never. I again reviewed the need to schedule screening colonoscopy. Names and numbers provided. 7. Health maintenance. Patient requests routine lab work. Has an issue with hypercholesterolemia in the past with apparent attempts at treatment which were discontinued due to side effects the medication. Will check baseline CBC comprehensive metabolic panel lipid profile urinalysis vitamin D TSH and hemoglobin A1c at her request given strong family history of diabetes. Patient knows importance of follow up with her primary physician if her lipids are abnormal. Follow up in one year, sooner if any issues.   Dara Lords MD, 9:38 AM 02/04/2015

## 2015-02-04 NOTE — Patient Instructions (Addendum)
Start on the Wellbutrin. Call me if you have any issues with this. Call me if you start to feel worse instead of better.  Follow up for bone density as scheduled.  Schedule your colonoscopy with either:  [facility]   Address: [address], [place], [place] [address]  [phone]    or  [facility]  Address: [address], [address], [place] [address]  [phone]      You may obtain a copy of any labs that were done today by logging onto MyChart as outlined in the instructions provided with your AVS (after visit summary). The office will not call with normal lab results but certainly if there are any significant abnormalities then we will contact you.   Health Maintenance Adopting a healthy lifestyle and getting preventive care can go a long way to promote health and wellness. Talk with your health care provider about what schedule of regular examinations is right for you. This is a good chance for you to check in with your provider about disease prevention and staying healthy. In between checkups, there are plenty of things you can do on your own. Experts have done a lot of research about which lifestyle changes and preventive measures are most likely to keep you healthy. Ask your health care provider for more information. WEIGHT AND DIET  Eat a healthy diet  Be sure to include plenty of vegetables, fruits, low-fat dairy products, and lean protein.  Do not eat a lot of foods high in solid fats, added sugars, or salt.  Get regular exercise. This is one of the most important things you can do for your health.  Most adults should exercise for at least 150 minutes each week. The exercise should increase your heart rate and make you sweat (moderate-intensity exercise).  Most adults should also do strengthening exercises at least twice a week. This is in addition to the moderate-intensity exercise.  Maintain a healthy weight  Body mass index (BMI)  is a measurement that can be used to identify possible weight problems. It estimates body fat based on height and weight. Your health care provider can help determine your BMI and help you achieve or maintain a healthy weight.  For females 89 years of age and older:   A BMI below 18.5 is considered underweight.  A BMI of 18.5 to 24.9 is normal.  A BMI of 25 to 29.9 is considered overweight.  A BMI of 30 and above is considered obese.  Watch levels of cholesterol and blood lipids  You should start having your blood tested for lipids and cholesterol at 54 years of age, then have this test every 5 years.  You may need to have your cholesterol levels checked more often if:  Your lipid or cholesterol levels are high.  You are older than 54 years of age.  You are at high risk for heart disease.  CANCER SCREENING   Lung Cancer  Lung cancer screening is recommended for adults 64-45 years old who are at high risk for lung cancer because of a history of smoking.  A yearly low-dose CT scan of the lungs is recommended for people who:  Currently smoke.  Have quit within the past 15 years.  Have at least a 30-pack-year history of smoking. A pack year is smoking an average of one pack of cigarettes a day for 1 year.  Yearly screening should continue until it has been 15 years since you quit.  Yearly screening  should stop if you develop a health problem that would prevent you from having lung cancer treatment.  Breast Cancer  Practice breast self-awareness. This means understanding how your breasts normally appear and feel.  It also means doing regular breast self-exams. Let your health care provider know about any changes, no matter how small.  If you are in your 20s or 30s, you should have a clinical breast exam (CBE) by a health care provider every 1-3 years as part of a regular health exam.  If you are 17 or older, have a CBE every year. Also consider having a breast X-ray  (mammogram) every year.  If you have a family history of breast cancer, talk to your health care provider about genetic screening.  If you are at high risk for breast cancer, talk to your health care provider about having an MRI and a mammogram every year.  Breast cancer gene (BRCA) assessment is recommended for women who have family members with BRCA-related cancers. BRCA-related cancers include:  Breast.  Ovarian.  Tubal.  Peritoneal cancers.  Results of the assessment will determine the need for genetic counseling and BRCA1 and BRCA2 testing. Cervical Cancer Routine pelvic examinations to screen for cervical cancer are no longer recommended for nonpregnant women who are considered low risk for cancer of the pelvic organs (ovaries, uterus, and vagina) and who do not have symptoms. A pelvic examination may be necessary if you have symptoms including those associated with pelvic infections. Ask your health care provider if a screening pelvic exam is right for you.   The Pap test is the screening test for cervical cancer for women who are considered at risk.  If you had a hysterectomy for a problem that was not cancer or a condition that could lead to cancer, then you no longer need Pap tests.  If you are older than 65 years, and you have had normal Pap tests for the past 10 years, you no longer need to have Pap tests.  If you have had past treatment for cervical cancer or a condition that could lead to cancer, you need Pap tests and screening for cancer for at least 20 years after your treatment.  If you no longer get a Pap test, assess your risk factors if they change (such as having a new sexual partner). This can affect whether you should start being screened again.  Some women have medical problems that increase their chance of getting cervical cancer. If this is the case for you, your health care provider may recommend more frequent screening and Pap tests.  The human  papillomavirus (HPV) test is another test that may be used for cervical cancer screening. The HPV test looks for the virus that can cause cell changes in the cervix. The cells collected during the Pap test can be tested for HPV.  The HPV test can be used to screen women 40 years of age and older. Getting tested for HPV can extend the interval between normal Pap tests from three to five years.  An HPV test also should be used to screen women of any age who have unclear Pap test results.  After 54 years of age, women should have HPV testing as often as Pap tests.  Colorectal Cancer  This type of cancer can be detected and often prevented.  Routine colorectal cancer screening usually begins at 54 years of age and continues through 54 years of age.  Your health care provider may recommend screening at an earlier  age if you have risk factors for colon cancer.  Your health care provider may also recommend using home test kits to check for hidden blood in the stool.  A small camera at the end of a tube can be used to examine your colon directly (sigmoidoscopy or colonoscopy). This is done to check for the earliest forms of colorectal cancer.  Routine screening usually begins at age 56.  Direct examination of the colon should be repeated every 5-10 years through 54 years of age. However, you may need to be screened more often if early forms of precancerous polyps or small growths are found. Skin Cancer  Check your skin from head to toe regularly.  Tell your health care provider about any new moles or changes in moles, especially if there is a change in a mole's shape or color.  Also tell your health care provider if you have a mole that is larger than the size of a pencil eraser.  Always use sunscreen. Apply sunscreen liberally and repeatedly throughout the day.  Protect yourself by wearing long sleeves, pants, a wide-brimmed hat, and sunglasses whenever you are outside. HEART DISEASE,  DIABETES, AND HIGH BLOOD PRESSURE   Have your blood pressure checked at least every 1-2 years. High blood pressure causes heart disease and increases the risk of stroke.  If you are between 12 years and 7 years old, ask your health care provider if you should take aspirin to prevent strokes.  Have regular diabetes screenings. This involves taking a blood sample to check your fasting blood sugar level.  If you are at a normal weight and have a low risk for diabetes, have this test once every three years after 54 years of age.  If you are overweight and have a high risk for diabetes, consider being tested at a younger age or more often. PREVENTING INFECTION  Hepatitis B  If you have a higher risk for hepatitis B, you should be screened for this virus. You are considered at high risk for hepatitis B if:  You were born in a country where hepatitis B is common. Ask your health care provider which countries are considered high risk.  Your parents were born in a high-risk country, and you have not been immunized against hepatitis B (hepatitis B vaccine).  You have HIV or AIDS.  You use needles to inject street drugs.  You live with someone who has hepatitis B.  You have had sex with someone who has hepatitis B.  You get hemodialysis treatment.  You take certain medicines for conditions, including cancer, organ transplantation, and autoimmune conditions. Hepatitis C  Blood testing is recommended for:  Everyone born from [birth year] through 1965.  Anyone with known risk factors for hepatitis C. Sexually transmitted infections (STIs)  You should be screened for sexually transmitted infections (STIs) including gonorrhea and chlamydia if:  You are sexually active and are younger than 54 years of age.  You are older than 54 years of age and your health care provider tells you that you are at risk for this type of infection.  Your sexual activity has changed since you were last screened and  you are at an increased risk for chlamydia or gonorrhea. Ask your health care provider if you are at risk.  If you do not have HIV, but are at risk, it may be recommended that you take a prescription medicine daily to prevent HIV infection. This is called pre-exposure prophylaxis (PrEP). You are considered at risk if:  You are sexually active and do not regularly use condoms or know the HIV status of your partner(s).  You take drugs by injection.  You are sexually active with a partner who has HIV. Talk with your health care provider about whether you are at high risk of being infected with HIV. If you choose to begin PrEP, you should first be tested for HIV. You should then be tested every 3 months for as long as you are taking PrEP.  PREGNANCY   If you are premenopausal and you may become pregnant, ask your health care provider about preconception counseling.  If you may become pregnant, take 400 to 800 micrograms (mcg) of folic acid every day.  If you want to prevent pregnancy, talk to your health care provider about birth control (contraception). OSTEOPOROSIS AND MENOPAUSE   Osteoporosis is a disease in which the bones lose minerals and strength with aging. This can result in serious bone fractures. Your risk for osteoporosis can be identified using a bone density scan.  If you are 67 years of age or older, or if you are at risk for osteoporosis and fractures, ask your health care provider if you should be screened.  Ask your health care provider whether you should take a calcium or vitamin D supplement to lower your risk for osteoporosis.  Menopause may have certain physical symptoms and risks.  Hormone replacement therapy may reduce some of these symptoms and risks. Talk to your health care provider about whether hormone replacement therapy is right for you.  HOME CARE INSTRUCTIONS   Schedule regular health, dental, and eye exams.  Stay current with your immunizations.   Do  not use any tobacco products including cigarettes, chewing tobacco, or electronic cigarettes.  If you are pregnant, do not drink alcohol.  If you are breastfeeding, limit how much and how often you drink alcohol.  Limit alcohol intake to no more than 1 drink per day for nonpregnant women. One drink equals 12 ounces of beer, 5 ounces of wine, or 1 ounces of hard liquor.  Do not use street drugs.  Do not share needles.  Ask your health care provider for help if you need support or information about quitting drugs.  Tell your health care provider if you often feel depressed.  Tell your health care provider if you have ever been abused or do not feel safe at home. Document Released: 11/27/2010 Document Revised: 09/28/2013 Document Reviewed: 04/15/2013 [facility] Patient Information 2015 [place], [place]. This information is not intended to replace advice given to you by your health care provider. Make sure you discuss any questions you have with your health care provider.

## 2015-02-05 LAB — COMPREHENSIVE METABOLIC PANEL
ALK PHOS: 49 U/L (ref 33–130)
ALT: 11 U/L (ref 6–29)
AST: 15 U/L (ref 10–35)
Albumin: 4.3 g/dL (ref 3.6–5.1)
BUN: 12 mg/dL (ref 7–25)
CHLORIDE: 108 mmol/L (ref 98–110)
CO2: 27 mmol/L (ref 20–31)
Calcium: 9.6 mg/dL (ref 8.6–10.4)
Creat: 0.72 mg/dL (ref 0.50–1.05)
Glucose, Bld: 97 mg/dL (ref 65–99)
Potassium: 4.8 mmol/L (ref 3.5–5.3)
Sodium: 139 mmol/L (ref 135–146)
Total Bilirubin: 0.5 mg/dL (ref 0.2–1.2)
Total Protein: 6.5 g/dL (ref 6.1–8.1)

## 2015-02-05 LAB — URINALYSIS W MICROSCOPIC + REFLEX CULTURE
BILIRUBIN URINE: NEGATIVE
Bacteria, UA: NONE SEEN [HPF]
Casts: NONE SEEN [LPF]
Crystals: NONE SEEN [HPF]
GLUCOSE, UA: NEGATIVE
Ketones, ur: NEGATIVE
Leukocytes, UA: NEGATIVE
NITRITE: NEGATIVE
PH: 7 (ref 5.0–8.0)
PROTEIN: NEGATIVE
Specific Gravity, Urine: 1.005 (ref 1.001–1.035)
Squamous Epithelial / LPF: NONE SEEN [HPF] (ref <=5)
WBC, UA: NONE SEEN WBC/HPF (ref <=5)
Yeast: NONE SEEN [HPF]

## 2015-02-05 LAB — VITAMIN D 25 HYDROXY (VIT D DEFICIENCY, FRACTURES): Vit D, 25-Hydroxy: 43 ng/mL (ref 30–100)

## 2015-02-05 LAB — LIPID PANEL
CHOL/HDL RATIO: 3.8 ratio (ref <=5.0)
Cholesterol: 229 mg/dL — ABNORMAL HIGH (ref 125–200)
HDL: 60 mg/dL (ref >=46)
LDL CALC: 134 mg/dL — AB (ref <130)
TRIGLYCERIDES: 176 mg/dL — AB (ref <150)
VLDL: 35 mg/dL — AB (ref <30)

## 2015-02-05 LAB — HEMOGLOBIN A1C
Hgb A1c MFr Bld: 5.6 % (ref <5.7)
Mean Plasma Glucose: 114 mg/dL (ref <117)

## 2015-02-05 LAB — TSH: TSH: 0.854 u[IU]/mL (ref 0.350–4.500)

## 2015-02-07 LAB — URINE CULTURE

## 2015-02-08 ENCOUNTER — Other Ambulatory Visit: Payer: Self-pay | Admitting: Gynecology

## 2015-02-08 MED ORDER — SULFAMETHOXAZOLE-TRIMETHOPRIM 800-160 MG PO TABS: 1.0000 | ORAL_TABLET | Freq: Two times a day (BID) | ORAL | Status: DC

## 2015-02-26 DIAGNOSIS — M8580 Other specified disorders of bone density and structure, unspecified site: Secondary | ICD-10-CM

## 2015-02-26 HISTORY — DX: Other specified disorders of bone density and structure, unspecified site: M85.80

## 2015-03-07 ENCOUNTER — Other Ambulatory Visit: Payer: Self-pay

## 2015-03-07 MED ORDER — BUPROPION HCL ER (XL) 150 MG PO TB24: 150.0000 mg | ORAL_TABLET | Freq: Every day | ORAL | Status: DC

## 2015-03-11 ENCOUNTER — Other Ambulatory Visit: Payer: Self-pay | Admitting: Gynecology

## 2015-03-11 DIAGNOSIS — M8580 Other specified disorders of bone density and structure, unspecified site: Secondary | ICD-10-CM

## 2015-03-11 DIAGNOSIS — M8589 Other specified disorders of bone density and structure, multiple sites: Secondary | ICD-10-CM

## 2015-03-15 ENCOUNTER — Ambulatory Visit (INDEPENDENT_AMBULATORY_CARE_PROVIDER_SITE_OTHER): Payer: BLUE CROSS/BLUE SHIELD

## 2015-03-15 ENCOUNTER — Encounter: Payer: Self-pay | Admitting: Gynecology

## 2015-03-15 DIAGNOSIS — M899 Disorder of bone, unspecified: Secondary | ICD-10-CM

## 2015-03-15 DIAGNOSIS — M8589 Other specified disorders of bone density and structure, multiple sites: Secondary | ICD-10-CM | POA: Diagnosis not present

## 2015-03-15 DIAGNOSIS — M8580 Other specified disorders of bone density and structure, unspecified site: Secondary | ICD-10-CM

## 2015-09-15 ENCOUNTER — Ambulatory Visit (INDEPENDENT_AMBULATORY_CARE_PROVIDER_SITE_OTHER): Payer: BLUE CROSS/BLUE SHIELD | Admitting: Gynecology

## 2015-09-15 ENCOUNTER — Encounter: Payer: Self-pay | Admitting: Gynecology

## 2015-09-15 VITALS — BP 122/78

## 2015-09-15 DIAGNOSIS — N898 Other specified noninflammatory disorders of vagina: Secondary | ICD-10-CM | POA: Diagnosis not present

## 2015-09-15 DIAGNOSIS — N3001 Acute cystitis with hematuria: Secondary | ICD-10-CM | POA: Diagnosis not present

## 2015-09-15 LAB — WET PREP FOR TRICH, YEAST, CLUE
CLUE CELLS WET PREP: NONE SEEN
Trich, Wet Prep: NONE SEEN
Yeast Wet Prep HPF POC: NONE SEEN

## 2015-09-15 LAB — URINALYSIS W MICROSCOPIC + REFLEX CULTURE
Bilirubin Urine: NEGATIVE
Casts: NONE SEEN [LPF]
Crystals: NONE SEEN [HPF]
Glucose, UA: NEGATIVE
Ketones, ur: NEGATIVE
LEUKOCYTES UA: NEGATIVE
NITRITE: NEGATIVE
PH: 7 (ref 5.0–8.0)
Protein, ur: NEGATIVE
SPECIFIC GRAVITY, URINE: 1.01 (ref 1.001–1.035)
WBC, UA: NONE SEEN WBC/HPF (ref <=5)
YEAST: NONE SEEN [HPF]

## 2015-09-15 MED ORDER — FLUCONAZOLE 150 MG PO TABS: 150.0000 mg | ORAL_TABLET | Freq: Once | ORAL | Status: DC

## 2015-09-15 MED ORDER — NITROFURANTOIN MONOHYD MACRO 100 MG PO CAPS: 100.0000 mg | ORAL_CAPSULE | Freq: Two times a day (BID) | ORAL | Status: DC

## 2015-09-15 NOTE — Patient Instructions (Signed)
Take the anabiotic twice daily for 7 days. Take the Diflucan pill once. Repeat a clean catch urinalysis in 2 weeks to make sure the blood in your urine goes away.

## 2015-09-15 NOTE — Progress Notes (Signed)
    Alexis HesselbachMary Beth Juarez 05/02/1961 [id number]        55 y.o.  [id number] Presents with 2 weeks of urinary frequency, mild dysuria and vaginal discharge. Also little bit of odor. No low back pain, fever or chills  Past medical history,surgical history, problem list, medications, allergies, family history and social history were all reviewed and documented in the EPIC chart.  Directed ROS with pertinent positives and negatives documented in the history of present illness/assessment and plan.  Exam: Kennon PortelaKim Gardner assistant Filed Vitals:   09/15/15 1019  BP: 122/78   General appearance:  Normal Spine straight without CVA tenderness Abdomen soft nontender without masses guarding rebound Pelvic external BUS vagina with white discharge. Cervix normal. Uterus normal size midline mobile nontender. Adnexa without masses or tenderness.  Assessment/Plan:  55 y.o. [id number] with history as above. Urinalysis consistent with a hemorrhagic cystitis with 10-20 RBCs and some bacteria. No significant white cells. Will cover with Macrobid 100 mg twice a day 7 days. Recommend follow up clean-catch urinalysis in 2 weeks to make sure that the hematuria clears. Alternatives to include stone or tumor also discussed. Wet prep was negative but exam was consistent with yeast. Will cover with Diflucan 150 mg 1 dose. Follow up if symptoms persist, worsen or recur.  Dara LordsFONTAINE,Kunta Hilleary P MD, 10:53 AM 09/15/2015

## 2015-09-16 LAB — URINE CULTURE
Colony Count: NO GROWTH
ORGANISM ID, BACTERIA: NO GROWTH

## 2015-10-19 ENCOUNTER — Telehealth: Payer: Self-pay | Admitting: *Deleted

## 2015-10-19 NOTE — Telephone Encounter (Signed)
Pt called stating [employer] would give her a discounted rate for a colon screening kit, pt said the representative clearly told her it did not need to be a colonoscopy. Pt asked if she could she have this done here,I explained to pt that we do hemoccult stool cards and if this is what the insurance is talking about I would have to get the okay from Dr.Fontaine who is out of the office until 10/25/15. Pt said she had a PCP appointment tomorrow and will check to see if Dr.Grisso can do this if she will call back

## 2016-01-09 ENCOUNTER — Encounter: Payer: Self-pay | Admitting: Gynecology

## 2016-02-28 ENCOUNTER — Encounter: Payer: BLUE CROSS/BLUE SHIELD | Admitting: Gynecology

## 2016-03-05 ENCOUNTER — Other Ambulatory Visit: Payer: Self-pay | Admitting: Gynecology

## 2016-03-11 NOTE — Progress Notes (Signed)
This encounter was created in error - please disregard.

## 2016-03-13 ENCOUNTER — Ambulatory Visit (INDEPENDENT_AMBULATORY_CARE_PROVIDER_SITE_OTHER): Payer: BLUE CROSS/BLUE SHIELD | Admitting: Gynecology

## 2016-03-13 ENCOUNTER — Encounter: Payer: Self-pay | Admitting: Gynecology

## 2016-03-13 VITALS — BP 118/76 | Ht 63.5 in | Wt 143.0 lb

## 2016-03-13 DIAGNOSIS — F4322 Adjustment disorder with anxiety: Secondary | ICD-10-CM | POA: Diagnosis not present

## 2016-03-13 DIAGNOSIS — Z79890 Hormone replacement therapy (postmenopausal): Secondary | ICD-10-CM | POA: Diagnosis not present

## 2016-03-13 DIAGNOSIS — N952 Postmenopausal atrophic vaginitis: Secondary | ICD-10-CM

## 2016-03-13 DIAGNOSIS — Z23 Encounter for immunization: Secondary | ICD-10-CM | POA: Diagnosis not present

## 2016-03-13 DIAGNOSIS — Z01419 Encounter for gynecological examination (general) (routine) without abnormal findings: Secondary | ICD-10-CM | POA: Diagnosis not present

## 2016-03-13 MED ORDER — BUPROPION HCL ER (XL) 150 MG PO TB24: 150.0000 mg | ORAL_TABLET | Freq: Every day | ORAL | 11 refills | Status: DC

## 2016-03-13 MED ORDER — ESTRADIOL 0.05 MG/24HR TD PTTW: 1.0000 | MEDICATED_PATCH | TRANSDERMAL | 11 refills | Status: DC

## 2016-03-13 MED ORDER — PROGESTERONE MICRONIZED 100 MG PO CAPS: ORAL_CAPSULE | ORAL | 11 refills | Status: DC

## 2016-03-13 NOTE — Progress Notes (Signed)
    Byrd HesselbachMary Beth Kneisley 1960-12-01 [id number]        55 y.o.  [id number]  for annual exam.  Several issues noted below.  Past medical history,surgical history, problem list, medications, allergies, family history and social history were all reviewed and documented as reviewed in the EPIC chart.  ROS:  Performed with pertinent positives and negatives included in the history, assessment and plan.   Additional significant findings :  None   Exam: Kennon PortelaKim Gardner assistant Vitals:   03/13/16 1524  BP: 118/76  Weight: 143 lb (64.9 kg)  Height: 5' 3.5" (1.613 m)   Body mass index is 24.93 kg/m.  General appearance:  Normal affect, orientation and appearance. Skin: Grossly normal HEENT: Without gross lesions.  No cervical or supraclavicular adenopathy. Thyroid normal.  Lungs:  Clear without wheezing, rales or rhonchi Cardiac: RR, without RMG Abdominal:  Soft, nontender, without masses, guarding, rebound, organomegaly or hernia Breasts:  Examined lying and sitting without masses, retractions, discharge or axillary adenopathy. Pelvic:  Ext, BUS, Vagina with atrophic changes  Cervix with atrophic changes  Uterus anteverted, normal size, shape and contour, midline and mobile nontender   Adnexa without masses or tenderness    Anus and perineum normal   Rectovaginal normal sphincter tone without palpated masses or tenderness.    Assessment/Plan:  55 y.o. [id number] female for annual exam.   1. Postmenopausal/mildly atrophic changes/HRT. Patient continues on Vivelle 0.05 mg patch and Prometrium 100 mg nightly. I reviewed the most recent 2017 [facility] guidelines on HRT. Benefits as far as symptom relief, cardiovascular/bone health if started early versus risks to include thrombosis such as stroke heart attack DVT and possible breast cancer. At this point patient wants to continue and I refilled her 1 year. 2. Depression/anxiety. Patient continues on Wellbutrin XL 150 mg doing well on this and wants to  continue. Was started after the death of her mother. Refill 1 year provided. 3. Osteopenia. DEXA 02/2015 T score -2.2 FRAX 12%/0.8%. Increased calcium vitamin D. Plan repeat DEXA 2 year interval next year. Benefits of HRT to her bone health reviewed. 4. Mammography 12/2015. Continue with annual mammography when due. SBE monthly reviewed. 5. Pap smear/HPV 2015. No Pap smear done today. No history of significant abnormal Pap smears. Plan repeat Pap smear at 3 year interval per current screening guidelines. 6. Colonoscopy 2017. Repeat at their recommended interval. 7. Health maintenance. No routine lab work done as patient reports this done elsewhere. Follow up 1 year, sooner as needed.  10 minutes of my time in excess of her routine gynecologic exam was spent in direct face to face counseling and coordination of care in regards to her HRT, review of new guidelines, risk versus benefit discussion.    Dara LordsFONTAINE,Elisavet Buehrer P MD, 3:49 PM 03/13/2016

## 2016-03-13 NOTE — Patient Instructions (Signed)

## 2016-03-13 NOTE — Addendum Note (Signed)
Addended by: Dayna BarkerGARDNER, Kellan Boehlke K on: 03/13/2016 04:29 PM   Modules accepted: Orders

## 2016-03-19 ENCOUNTER — Telehealth: Payer: Self-pay | Admitting: *Deleted

## 2016-03-19 MED ORDER — SULFAMETHOXAZOLE-TRIMETHOPRIM 800-160 MG PO TABS: 1.0000 | ORAL_TABLET | Freq: Two times a day (BID) | ORAL | 0 refills | Status: DC

## 2016-03-19 NOTE — Telephone Encounter (Signed)
Okay for Septra DS 1 by mouth twice a day 3 days 

## 2016-03-19 NOTE — Telephone Encounter (Signed)
Pt called c/o frequent urination and burning urination with pressure. Pt aware that office visit best, unable to come in due to work,states just here on 03/13/16. Pt asked if Rx could be sent to pharmacy.

## 2016-03-19 NOTE — Telephone Encounter (Signed)
Pt informed with the below note. Rx sent.  

## 2016-04-02 ENCOUNTER — Other Ambulatory Visit: Payer: Self-pay | Admitting: Gynecology

## 2016-06-08 ENCOUNTER — Other Ambulatory Visit: Payer: Self-pay

## 2016-06-08 MED ORDER — ESTRADIOL 0.05 MG/24HR TD PTTW: 1.0000 | MEDICATED_PATCH | TRANSDERMAL | 3 refills | Status: DC

## 2016-06-08 MED ORDER — BUPROPION HCL ER (XL) 150 MG PO TB24: 150.0000 mg | ORAL_TABLET | Freq: Every day | ORAL | 3 refills | Status: DC

## 2017-03-20 ENCOUNTER — Ambulatory Visit (INDEPENDENT_AMBULATORY_CARE_PROVIDER_SITE_OTHER): Payer: BLUE CROSS/BLUE SHIELD | Admitting: Gynecology

## 2017-03-20 DIAGNOSIS — Z23 Encounter for immunization: Secondary | ICD-10-CM

## 2017-04-04 ENCOUNTER — Other Ambulatory Visit: Payer: Self-pay | Admitting: Gynecology

## 2017-04-25 ENCOUNTER — Ambulatory Visit (INDEPENDENT_AMBULATORY_CARE_PROVIDER_SITE_OTHER): Payer: BLUE CROSS/BLUE SHIELD | Admitting: Gynecology

## 2017-04-25 ENCOUNTER — Encounter: Payer: Self-pay | Admitting: Gynecology

## 2017-04-25 VITALS — BP 138/80 | Ht 63.5 in | Wt 150.0 lb

## 2017-04-25 DIAGNOSIS — M8580 Other specified disorders of bone density and structure, unspecified site: Secondary | ICD-10-CM

## 2017-04-25 DIAGNOSIS — G4700 Insomnia, unspecified: Secondary | ICD-10-CM

## 2017-04-25 DIAGNOSIS — Z01411 Encounter for gynecological examination (general) (routine) with abnormal findings: Secondary | ICD-10-CM | POA: Diagnosis not present

## 2017-04-25 DIAGNOSIS — Z79890 Hormone replacement therapy (postmenopausal): Secondary | ICD-10-CM

## 2017-04-25 DIAGNOSIS — N952 Postmenopausal atrophic vaginitis: Secondary | ICD-10-CM

## 2017-04-25 MED ORDER — ZOLPIDEM TARTRATE 5 MG PO TABS: 5.0000 mg | ORAL_TABLET | Freq: Every evening | ORAL | 3 refills | Status: DC | PRN

## 2017-04-25 MED ORDER — BUPROPION HCL ER (XL) 150 MG PO TB24: 150.0000 mg | ORAL_TABLET | Freq: Every day | ORAL | 11 refills | Status: DC

## 2017-04-25 NOTE — Progress Notes (Signed)
Alexis HesselbachMary Beth Juarez [birth year] [id number]        56 y.o.  [id number] for annual gynecologic exam.  Stopped her HRT several months ago and has done well as far as no hot flushes or sweats.  No significant vaginal dryness or dyspareunia.  Is having issues with insomnia.  Had tried low-dose Xanax in the past but finds this no longer helps her sleep.  Currently being evaluated for chronic cough and is on antireflux protocol to see if this does not help.  Notes her cough is a little better but still present.  She is following up with ENT in reference to this.  Notes her coughing gets worse when she sleeps and lays down at night.  Past medical history,surgical history, problem list, medications, allergies, family history and social history were all reviewed and documented as reviewed in the EPIC chart.  ROS:  Performed with pertinent positives and negatives included in the history, assessment and plan.   Additional significant findings : None   Exam: Alexis PortelaKim Juarez assistant Vitals:   04/25/17 1025  BP: 138/80  Weight: 150 lb (68 kg)  Height: 5' 3.5" (1.613 m)   Body mass index is 26.15 kg/m.  General appearance:  Normal affect, orientation and appearance. Skin: Grossly normal HEENT: Without gross lesions.  No cervical or supraclavicular adenopathy. Thyroid normal.  Lungs:  Clear without wheezing, rales or rhonchi Cardiac: RR, without RMG Abdominal:  Soft, nontender, without masses, guarding, rebound, organomegaly or hernia Breasts:  Examined lying and sitting without masses, retractions, discharge or axillary adenopathy. Pelvic:  Ext, BUS, Vagina: With atrophic changes  Cervix: With atrophic changes  Uterus: Anteverted, normal size, shape and contour, midline and mobile nontender   Adnexa: Without masses or tenderness    Anus and perineum: Normal   Rectovaginal: Normal sphincter tone without palpated masses or tenderness.    Assessment/Plan:  56 y.o. [id number] female for annual gynecologic  exam.   1. Postmenopausal/atrophic genital changes.  Has stopped her HRT and is doing well without it.  At this point she will remain off of her HRT and we will see how she does.  She is done no bleeding and knows to call if any bleeding. 2. Depression/anxiety.  Is on Wellbutrin XL 150 mg daily.  Doing well with this without side effects.  Patient wants to continue and I refilled her times 1 year. 3. Insomnia.  Is having issues with coughing at this point.  Various options were reviewed with her.  We will try transient sleep aid with Ambien 5 mg #30 with 3 refills.  Side effect profiles and risks were reviewed with the patient.  We will get her through the coughing evaluation and treatment and hopefully this will resolve and then will see how she does from a sleep standpoint. 4. Mammography 12/2015.  Patient overdue and knows to schedule.  Breast exam normal today. 5. Osteopenia.  DEXA 02/2015 T score -2.2 FRAX 12% / 0.8%.  Repeat DEXA now on patient will schedule. 6. Colonoscopy 2017.  Repeat at their recommended interval. 7. Pap smear/HPV 2015 negative.  No Pap smear done today.  No history of significant abnormal Pap smears.  Plan repeat Pap smear at 5-year interval per current screening guidelines. 8. Health maintenance.  No routine lab work done as patient does this elsewhere.  Follow-up 1 year, sooner as needed.  Additional time in excess of her pain gynecologic exam was spent in direct face to face counseling and coordination of care in  regards to her insomnia.    Dara Lordsimothy P Jehan Ranganathan MD, 10:56 AM 04/25/2017

## 2017-04-25 NOTE — Patient Instructions (Signed)
Follow-up for bone density as scheduled.  Follow-up in 1 year for annual exam. 

## 2017-06-13 ENCOUNTER — Other Ambulatory Visit: Payer: Self-pay | Admitting: Otolaryngology

## 2017-08-29 ENCOUNTER — Other Ambulatory Visit: Payer: Self-pay | Admitting: Gynecology

## 2018-06-18 ENCOUNTER — Encounter: Payer: Self-pay | Admitting: Gynecology

## 2018-06-18 ENCOUNTER — Ambulatory Visit (INDEPENDENT_AMBULATORY_CARE_PROVIDER_SITE_OTHER): Payer: BLUE CROSS/BLUE SHIELD | Admitting: Gynecology

## 2018-06-18 VITALS — BP 130/84 | Ht 64.0 in | Wt 152.0 lb

## 2018-06-18 DIAGNOSIS — Z01419 Encounter for gynecological examination (general) (routine) without abnormal findings: Secondary | ICD-10-CM

## 2018-06-18 DIAGNOSIS — N952 Postmenopausal atrophic vaginitis: Secondary | ICD-10-CM | POA: Diagnosis not present

## 2018-06-18 DIAGNOSIS — Z1151 Encounter for screening for human papillomavirus (HPV): Secondary | ICD-10-CM

## 2018-06-18 DIAGNOSIS — M8580 Other specified disorders of bone density and structure, unspecified site: Secondary | ICD-10-CM | POA: Diagnosis not present

## 2018-06-18 DIAGNOSIS — Z13220 Encounter for screening for lipoid disorders: Secondary | ICD-10-CM

## 2018-06-18 NOTE — Addendum Note (Signed)
Addended by: Dayna Barker on: 06/18/2018 08:57 AM   Modules accepted: Orders

## 2018-06-18 NOTE — Progress Notes (Signed)
    Byrd HesselbachMary Beth Weng [birth year] [id number]        58 y.o.  [id number] for annual gynecologic exam.  Without gynecologic complaints  Past medical history,surgical history, problem list, medications, allergies, family history and social history were all reviewed and documented as reviewed in the EPIC chart.  ROS:  Performed with pertinent positives and negatives included in the history, assessment and plan.   Additional significant findings : None   Exam: Kennon PortelaKim Gardner assistant Vitals:   06/18/18 0807  BP: 130/84  Weight: 152 lb (68.9 kg)  Height: 5\' 4"  (1.626 m)   Body mass index is 26.09 kg/m.  General appearance:  Normal affect, orientation and appearance. Skin: Grossly normal HEENT: Without gross lesions.  No cervical or supraclavicular adenopathy. Thyroid normal.  Lungs:  Clear without wheezing, rales or rhonchi Cardiac: RR, without RMG Abdominal:  Soft, nontender, without masses, guarding, rebound, organomegaly or hernia Breasts:  Examined lying and sitting without masses, retractions, discharge or axillary adenopathy. Pelvic:  Ext, BUS, Vagina: Normal with atrophic changes  Cervix: Normal with atrophic changes.  Pap smear/HPV  Uterus: Anteverted, normal size, shape and contour, midline and mobile nontender   Adnexa: Without masses or tenderness    Anus and perineum: Normal   Rectovaginal: Normal sphincter tone without palpated masses or tenderness.    Assessment/Plan:  58 y.o. [id number] female for annual gynecologic exam.  1. Postmenopausal.  Without significant menopausal symptoms.  No vaginal bleeding. 2. History of depression/anxiety.  Medications are managed elsewhere. 3. Mammography due and patient will schedule in follow-up for this.  Breast exam normal today. 4. Osteopenia.  DEXA 2016 T score -2.2 FRAX 12% / 0.8%.  Was to do DEXA last year but did not happen.  Schedule DEXA now and patient agrees to follow-up for this.  Check TSH, vitamin D today. 5. Pap smear/HPV  2015.  Pap smear/HPV today.  No history of significant abnormal Pap smears. 6. Colonoscopy 2017.  Repeat at their recommended interval. 7. Health maintenance.  Requests baseline labs.  CBC, CMP, lipid profile, TSH and vitamin D ordered.  Follow-up for bone density otherwise 1 year, sooner as needed.     Dara Lordsimothy P Fontaine MD, 8:38 AM 06/18/2018

## 2018-06-18 NOTE — Patient Instructions (Signed)
Follow-up for the bone density as scheduled. 

## 2018-06-19 LAB — LIPID PANEL
CHOLESTEROL: 297 mg/dL — AB (ref <200)
HDL: 57 mg/dL (ref >50)
LDL Cholesterol (Calc): 200 mg/dL (calc) — ABNORMAL HIGH
Non-HDL Cholesterol (Calc): 240 mg/dL (calc) — ABNORMAL HIGH (ref <130)
Total CHOL/HDL Ratio: 5.2 (calc) — ABNORMAL HIGH (ref <5.0)
Triglycerides: 213 mg/dL — ABNORMAL HIGH (ref <150)

## 2018-06-19 LAB — CBC WITH DIFFERENTIAL/PLATELET
Absolute Monocytes: 605 cells/uL (ref 200–950)
BASOS ABS: 88 {cells}/uL (ref 0–200)
Basophils Relative: 1.4 %
EOS ABS: 567 {cells}/uL — AB (ref 15–500)
Eosinophils Relative: 9 %
HCT: 41.6 % (ref 35.0–45.0)
Hemoglobin: 14.4 g/dL (ref 11.7–15.5)
Lymphs Abs: 2400 cells/uL (ref 850–3900)
MCH: 31.9 pg (ref 27.0–33.0)
MCHC: 34.6 g/dL (ref 32.0–36.0)
MCV: 92 fL (ref 80.0–100.0)
MONOS PCT: 9.6 %
MPV: 11.5 fL (ref 7.5–12.5)
NEUTROS ABS: 2640 {cells}/uL (ref 1500–7800)
Neutrophils Relative %: 41.9 %
Platelets: 209 10*3/uL (ref 140–400)
RBC: 4.52 10*6/uL (ref 3.80–5.10)
RDW: 12.3 % (ref 11.0–15.0)
Total Lymphocyte: 38.1 %
WBC: 6.3 10*3/uL (ref 3.8–10.8)

## 2018-06-19 LAB — PAP IG AND HPV HIGH-RISK: HPV DNA High Risk: NOT DETECTED

## 2018-06-19 LAB — COMPREHENSIVE METABOLIC PANEL
AG Ratio: 1.8 (calc) (ref 1.0–2.5)
ALKALINE PHOSPHATASE (APISO): 54 U/L (ref 33–130)
ALT: 23 U/L (ref 6–29)
AST: 21 U/L (ref 10–35)
Albumin: 4.2 g/dL (ref 3.6–5.1)
BUN: 20 mg/dL (ref 7–25)
CO2: 29 mmol/L (ref 20–32)
Calcium: 9.1 mg/dL (ref 8.6–10.4)
Chloride: 103 mmol/L (ref 98–110)
Creat: 0.72 mg/dL (ref 0.50–1.05)
Globulin: 2.4 g/dL (calc) (ref 1.9–3.7)
Glucose, Bld: 94 mg/dL (ref 65–99)
Potassium: 3.8 mmol/L (ref 3.5–5.3)
Sodium: 140 mmol/L (ref 135–146)
Total Bilirubin: 0.5 mg/dL (ref 0.2–1.2)
Total Protein: 6.6 g/dL (ref 6.1–8.1)

## 2018-06-19 LAB — TSH: TSH: 0.69 mIU/L (ref 0.40–4.50)

## 2018-06-19 LAB — VITAMIN D 25 HYDROXY (VIT D DEFICIENCY, FRACTURES): Vit D, 25-Hydroxy: 28 ng/mL — ABNORMAL LOW (ref 30–100)

## 2019-02-18 ENCOUNTER — Encounter: Payer: Self-pay | Admitting: Gynecology

## 2019-02-20 ENCOUNTER — Encounter: Payer: Self-pay | Admitting: Gynecology

## 2019-02-24 ENCOUNTER — Encounter: Payer: Self-pay | Admitting: Gynecology

## 2019-02-25 NOTE — Telephone Encounter (Signed)
Thank you for those kind words.  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  We have certainly been through a lot together.  All the best

## 2019-06-22 ENCOUNTER — Other Ambulatory Visit: Payer: Self-pay

## 2019-06-22 ENCOUNTER — Ambulatory Visit (INDEPENDENT_AMBULATORY_CARE_PROVIDER_SITE_OTHER): Payer: BC Managed Care – PPO | Admitting: Obstetrics and Gynecology

## 2019-06-22 ENCOUNTER — Encounter: Payer: Self-pay | Admitting: Obstetrics and Gynecology

## 2019-06-22 VITALS — BP 120/78 | Ht 63.5 in | Wt 152.0 lb

## 2019-06-22 DIAGNOSIS — E7800 Pure hypercholesterolemia, unspecified: Secondary | ICD-10-CM

## 2019-06-22 DIAGNOSIS — E785 Hyperlipidemia, unspecified: Secondary | ICD-10-CM | POA: Insufficient documentation

## 2019-06-22 DIAGNOSIS — Z01419 Encounter for gynecological examination (general) (routine) without abnormal findings: Secondary | ICD-10-CM | POA: Diagnosis not present

## 2019-06-22 DIAGNOSIS — Z13820 Encounter for screening for osteoporosis: Secondary | ICD-10-CM | POA: Diagnosis not present

## 2019-06-22 NOTE — Patient Instructions (Signed)
It was great to meet you today. Please schedule your DEXA scan as convenient for you.

## 2019-06-22 NOTE — Progress Notes (Signed)
   Alexis Juarez 1961/03/16 [id number]  SUBJECTIVE:  59 y.o. [id number] female for annual routine gynecologic exam. She has no gynecologic concerns.  Current Outpatient Medications  Medication Sig Dispense Refill  . ALPRAZolam (XANAX) 0.25 MG tablet Take 0.25 mg by mouth at bedtime as needed for anxiety.    Marland Kitchen aspirin 81 MG tablet Take 81 mg by mouth daily.    Marland Kitchen CALCIUM PO Take by mouth.      . losartan (COZAAR) 25 MG tablet Take 25 mg by mouth daily.    . Multiple Vitamin (MULTIVITAMIN) tablet Take 1 tablet by mouth daily.    . sertraline (ZOLOFT) 25 MG tablet Take 25 mg by mouth daily.    Marland Kitchen VITAMIN D PO Take by mouth.     No current facility-administered medications for this visit.   Allergies: Patient has no known allergies.  Patient's last menstrual period was 03/22/2007.  Past medical history,surgical history, problem list, medications, allergies, family history and social history were all reviewed and documented as reviewed in the EPIC chart.  ROS:  Feeling well. No dyspnea or chest pain on exertion.  No abdominal pain, change in bowel habits, black or bloody stools.  No urinary tract symptoms. GYN ROS: normal menses, no abnormal bleeding, pelvic pain or discharge, no breast pain or new or enlarging lumps on self exam. No neurological complaints.    OBJECTIVE:  BP 120/78   Ht 5' 3.5" (1.613 m)   Wt 152 lb (68.9 kg)   LMP 03/22/2007   BMI 26.50 kg/m  The patient appears well, alert, oriented x 3, in no distress. ENT normal.  Neck supple. No cervical or supraclavicular adenopathy or thyromegaly.  Lungs are clear, good air entry, no wheezes, rhonchi or rales. S1 and S2 normal, no murmurs, regular rate and rhythm.  Abdomen soft without tenderness, guarding, mass or organomegaly.  Neurological is normal, no focal findings.  BREAST EXAM: breasts appear normal, no suspicious masses, no skin or nipple changes or axillary nodes  PELVIC EXAM: VULVA: normal appearing vulva with no  masses, tenderness or lesions, mild atrophy signs. VAGINA: normal appearing vagina with normal color and discharge, no lesions, CERVIX: normal appearing cervix without discharge or lesions, UTERUS: uterus is normal size, shape, consistency and nontender, ADNEXA: normal adnexa in size, nontender and no masses  Chaperone: Kennon Portela present during the examination  ASSESSMENT:  59 y.o. Alexis Juarez here for annual gynecologic exam  PLAN:   1. Postmenopausal.  No concerns at this time. 2. Pap smear 05/2018. Not repeated today. Next Pap smear due 2023 following the current screening guidelines calling for the 3-year interval. 3. Mammogram. Will continue with annual mammography. Breast exam normal today. 4. Colonoscopy 2017. Recommended that she continue per the prescribed interval.  5. Osteopenia.  Last DEXA in 2016, and the study was ordered last year but this was canceled with the pandemic situation, so she still needs to get this done. Recommended that she complete her DEXA soon and she will plan to go schedule after the visit today. 6. Health maintenance.  Hypercholesterolemia and high triglycerides noted on last year's blood work. Reports she will be seen in Dr. Anders Grant office in [place] for follow up next week.  Hypertension appears well controlled.  Return annually or prn.  Ilda Foil MD  06/22/19

## 2019-06-23 NOTE — Addendum Note (Signed)
Addended by: Dayna Barker on: 06/23/2019 11:21 AM   Modules accepted: Orders

## 2019-07-03 ENCOUNTER — Other Ambulatory Visit: Payer: Self-pay | Admitting: *Deleted

## 2019-07-03 DIAGNOSIS — Z780 Asymptomatic menopausal state: Secondary | ICD-10-CM

## 2019-07-03 DIAGNOSIS — M8589 Other specified disorders of bone density and structure, multiple sites: Secondary | ICD-10-CM

## 2019-12-31 DIAGNOSIS — L508 Other urticaria: Secondary | ICD-10-CM | POA: Diagnosis not present

## 2019-12-31 DIAGNOSIS — Z5181 Encounter for therapeutic drug level monitoring: Secondary | ICD-10-CM | POA: Diagnosis not present

## 2019-12-31 DIAGNOSIS — R002 Palpitations: Secondary | ICD-10-CM | POA: Diagnosis not present

## 2019-12-31 DIAGNOSIS — K219 Gastro-esophageal reflux disease without esophagitis: Secondary | ICD-10-CM | POA: Diagnosis not present

## 2019-12-31 DIAGNOSIS — E782 Mixed hyperlipidemia: Secondary | ICD-10-CM | POA: Diagnosis not present

## 2019-12-31 DIAGNOSIS — G43909 Migraine, unspecified, not intractable, without status migrainosus: Secondary | ICD-10-CM | POA: Diagnosis not present

## 2019-12-31 DIAGNOSIS — I10 Essential (primary) hypertension: Secondary | ICD-10-CM | POA: Diagnosis not present

## 2020-02-23 ENCOUNTER — Encounter: Payer: Self-pay | Admitting: Obstetrics and Gynecology

## 2020-02-23 DIAGNOSIS — Z1231 Encounter for screening mammogram for malignant neoplasm of breast: Secondary | ICD-10-CM | POA: Diagnosis not present

## 2020-04-07 DIAGNOSIS — H6121 Impacted cerumen, right ear: Secondary | ICD-10-CM | POA: Diagnosis not present

## 2020-05-09 ENCOUNTER — Encounter: Payer: Self-pay | Admitting: Obstetrics and Gynecology

## 2020-06-22 ENCOUNTER — Encounter: Payer: BC Managed Care – PPO | Admitting: Obstetrics and Gynecology

## 2020-06-29 ENCOUNTER — Encounter: Payer: Self-pay | Admitting: Obstetrics and Gynecology

## 2020-06-29 ENCOUNTER — Ambulatory Visit (INDEPENDENT_AMBULATORY_CARE_PROVIDER_SITE_OTHER): Payer: BC Managed Care – PPO | Admitting: Obstetrics and Gynecology

## 2020-06-29 ENCOUNTER — Other Ambulatory Visit: Payer: Self-pay

## 2020-06-29 VITALS — BP 133/88 | HR 62 | Ht 63.19 in | Wt 135.2 lb

## 2020-06-29 DIAGNOSIS — M8589 Other specified disorders of bone density and structure, multiple sites: Secondary | ICD-10-CM | POA: Diagnosis not present

## 2020-06-29 DIAGNOSIS — F5109 Other insomnia not due to a substance or known physiological condition: Secondary | ICD-10-CM

## 2020-06-29 DIAGNOSIS — Z01419 Encounter for gynecological examination (general) (routine) without abnormal findings: Secondary | ICD-10-CM

## 2020-06-29 DIAGNOSIS — Z780 Asymptomatic menopausal state: Secondary | ICD-10-CM

## 2020-06-29 DIAGNOSIS — N898 Other specified noninflammatory disorders of vagina: Secondary | ICD-10-CM

## 2020-06-29 LAB — WET PREP FOR TRICH, YEAST, CLUE

## 2020-06-29 MED ORDER — ALPRAZOLAM 0.25 MG PO TABS: 0.2500 mg | ORAL_TABLET | Freq: Every evening | ORAL | 1 refills | Status: AC | PRN

## 2020-06-29 NOTE — Progress Notes (Signed)
Curlie Sittner June 19, 1960 [id number]  SUBJECTIVE:  60 y.o. [id number] female for annual routine gynecologic exam. Has issue with vaginal itching x 2 days.  Also chronic painful intercourse since menopause.  Was previously on HRT but stopped a few years back.  Has not previously tried localized estrogen therapies.  Having trouble sleeping with anxiety due to recent loss of her brother.   Current Outpatient Medications  Medication Sig Dispense Refill  . ALPRAZolam (XANAX) 0.25 MG tablet Take 0.25 mg by mouth at bedtime as needed for anxiety.    Marland Kitchen aspirin 81 MG tablet Take 81 mg by mouth daily.    Marland Kitchen CALCIUM PO Take by mouth.      . losartan (COZAAR) 25 MG tablet Take 25 mg by mouth daily.    . Multiple Vitamin (MULTIVITAMIN) tablet Take 1 tablet by mouth daily.    . sertraline (ZOLOFT) 25 MG tablet Take 25 mg by mouth daily.    Marland Kitchen VITAMIN D PO Take by mouth.     No current facility-administered medications for this visit.   Allergies: Patient has no known allergies.  Patient's last menstrual period was 03/22/2007.  Past medical history,surgical history, problem list, medications, allergies, family history and social history were all reviewed and documented as reviewed in the EPIC chart.  ROS: Pertinent positives and negatives as reviewed in HPI    OBJECTIVE:  BP 133/88 (BP Location: Right Arm)   Pulse 62   Ht 5' 3.19" (1.605 m)   Wt 135 lb 3.2 oz (61.3 kg)   LMP 03/22/2007   BMI 23.81 kg/m  The patient appears well, alert, oriented x 3, in no distress. ENT normal.  Neck supple. No cervical or supraclavicular adenopathy or thyromegaly.  Lungs are clear, good air entry, no wheezes, rhonchi or rales. S1 and S2 normal, no murmurs, regular rate and rhythm.  Abdomen soft without tenderness, guarding, mass or organomegaly.  Neurological is normal, no focal findings.  BREAST EXAM: breasts appear normal, no suspicious masses, no skin or nipple changes or axillary nodes  PELVIC EXAM:  VULVA: normal appearing vulva with atrophic change, no masses, tenderness or lesions, mild atrophy signs. VAGINA: normal appearing vagina with atrophic change, normal color and discharge, no lesions, CERVIX: normal appearing cervix without discharge or lesions, UTERUS: uterus is normal size, shape, consistency and nontender, ADNEXA: normal adnexa in size, nontender and no masses WET MOUNT: Negative  Chaperone: Claudette Laws present during the examination  ASSESSMENT:  60 y.o. [id number] here for annual gynecologic exam  PLAN:   1. Postmenopausal/atrophic vaginitis.  Wet mount negative today, no sign of yeast infection.  Could try OTC Monistat cream for relief otherwise vaginocele.  We also discussed localized estrogen therapy with cream or tablet.  We reviewed that this could help with the dyspareunia that would be presumably due to postmenopausal vulvovaginal atrophy.  We discussed the risks of using estrogens to include systemic absorption and arthritic thrombotic disease risk and breast cancer, endometrial cancer, should be low risk with low dose use as recommended.  She would like to think about it and will let us know if she wants to proceed with estrogen cream therapy. 2. Pap smear 05/2018. Not repeated today. Next Pap smear due at her next annual exam in 2023 following the current screening guidelines calling for the 3-year interval. 3. Mammogram 01/2020. Will continue with annual mammography. Breast exam normal today. 4. Colonoscopy 2017. Recommended that she continue per the prescribed interval.  5. Osteopenia.  Last DEXA  in 2016, and the study was ordered last year.  She says the test was canceled with the pandemic situation, so she still needs to get this done. Recommended that she complete her DEXA soon and she acknowledges the recommendation. 6.  Situational anxiety/insomnia.  Has used low-dose of alprazolam in the past, 0.25 mg at bedtime as needed.  Given recent loss in her family it would be  reasonable to refill this for her, we reviewed some of the potential side effects and addictive properties, meant for short-term use only.  Prescription for alprazolam 0.25 mg at bedtime as needed #30, 1 refill sent to her pharmacy. 7. Health maintenance.  She has her routine screening labs performed elsewhere.  The patient is aware that I will only be at this practice until early March 2022 so she knows to make sure she requests follow-up on results for any medical tests that she does when I am no longer at the practice.  Return annually or prn.  Theresia Majors MD  06/29/20

## 2020-07-11 DIAGNOSIS — I10 Essential (primary) hypertension: Secondary | ICD-10-CM | POA: Diagnosis not present

## 2020-07-11 DIAGNOSIS — Z833 Family history of diabetes mellitus: Secondary | ICD-10-CM | POA: Diagnosis not present

## 2020-07-11 DIAGNOSIS — Z87891 Personal history of nicotine dependence: Secondary | ICD-10-CM | POA: Diagnosis not present

## 2020-07-11 DIAGNOSIS — N952 Postmenopausal atrophic vaginitis: Secondary | ICD-10-CM | POA: Diagnosis not present

## 2020-07-11 DIAGNOSIS — L508 Other urticaria: Secondary | ICD-10-CM | POA: Diagnosis not present

## 2020-07-11 DIAGNOSIS — K219 Gastro-esophageal reflux disease without esophagitis: Secondary | ICD-10-CM | POA: Diagnosis not present

## 2020-07-11 DIAGNOSIS — Z789 Other specified health status: Secondary | ICD-10-CM | POA: Diagnosis not present

## 2020-07-11 DIAGNOSIS — R002 Palpitations: Secondary | ICD-10-CM | POA: Diagnosis not present

## 2020-07-11 DIAGNOSIS — Z0000 Encounter for general adult medical examination without abnormal findings: Secondary | ICD-10-CM | POA: Diagnosis not present

## 2020-07-11 DIAGNOSIS — M8580 Other specified disorders of bone density and structure, unspecified site: Secondary | ICD-10-CM | POA: Diagnosis not present

## 2020-07-11 DIAGNOSIS — E782 Mixed hyperlipidemia: Secondary | ICD-10-CM | POA: Diagnosis not present

## 2020-07-11 DIAGNOSIS — F419 Anxiety disorder, unspecified: Secondary | ICD-10-CM | POA: Diagnosis not present

## 2021-01-05 DIAGNOSIS — R0981 Nasal congestion: Secondary | ICD-10-CM | POA: Diagnosis not present

## 2021-01-05 DIAGNOSIS — J019 Acute sinusitis, unspecified: Secondary | ICD-10-CM | POA: Diagnosis not present

## 2021-01-10 DIAGNOSIS — G43909 Migraine, unspecified, not intractable, without status migrainosus: Secondary | ICD-10-CM | POA: Diagnosis not present

## 2021-01-10 DIAGNOSIS — L508 Other urticaria: Secondary | ICD-10-CM | POA: Diagnosis not present

## 2021-01-10 DIAGNOSIS — F419 Anxiety disorder, unspecified: Secondary | ICD-10-CM | POA: Diagnosis not present

## 2021-01-10 DIAGNOSIS — H9011 Conductive hearing loss, unilateral, right ear, with unrestricted hearing on the contralateral side: Secondary | ICD-10-CM | POA: Diagnosis not present

## 2021-01-10 DIAGNOSIS — E782 Mixed hyperlipidemia: Secondary | ICD-10-CM | POA: Diagnosis not present

## 2021-01-10 DIAGNOSIS — I10 Essential (primary) hypertension: Secondary | ICD-10-CM | POA: Diagnosis not present

## 2021-01-10 DIAGNOSIS — K219 Gastro-esophageal reflux disease without esophagitis: Secondary | ICD-10-CM | POA: Diagnosis not present

## 2021-02-23 ENCOUNTER — Encounter: Payer: Self-pay | Admitting: Nurse Practitioner

## 2021-02-23 DIAGNOSIS — Z1231 Encounter for screening mammogram for malignant neoplasm of breast: Secondary | ICD-10-CM | POA: Diagnosis not present

## 2021-07-08 DIAGNOSIS — N3001 Acute cystitis with hematuria: Secondary | ICD-10-CM | POA: Diagnosis not present

## 2021-07-25 ENCOUNTER — Encounter: Payer: Self-pay | Admitting: Nurse Practitioner

## 2021-07-25 ENCOUNTER — Ambulatory Visit (INDEPENDENT_AMBULATORY_CARE_PROVIDER_SITE_OTHER): Payer: BC Managed Care – PPO | Admitting: Nurse Practitioner

## 2021-07-25 ENCOUNTER — Other Ambulatory Visit: Payer: Self-pay

## 2021-07-25 VITALS — BP 124/80 | Ht 63.0 in | Wt 144.0 lb

## 2021-07-25 DIAGNOSIS — M8589 Other specified disorders of bone density and structure, multiple sites: Secondary | ICD-10-CM

## 2021-07-25 DIAGNOSIS — Z8262 Family history of osteoporosis: Secondary | ICD-10-CM | POA: Diagnosis not present

## 2021-07-25 DIAGNOSIS — N941 Unspecified dyspareunia: Secondary | ICD-10-CM

## 2021-07-25 DIAGNOSIS — Z01419 Encounter for gynecological examination (general) (routine) without abnormal findings: Secondary | ICD-10-CM

## 2021-07-25 DIAGNOSIS — Z780 Asymptomatic menopausal state: Secondary | ICD-10-CM

## 2021-07-25 NOTE — Progress Notes (Signed)
Alexis Juarez 06-03-1960 [id number]   History:  61 y.o. [id number] presents for annual exam. Postmenopausal - stopped HRT a few years back, no bleeding. Normal pap history. HTN, migraines managed by PCP. Complains of painful intercourse due to dryness. Does not use lubrication.   Gynecologic History Patient's last menstrual period was 03/22/2007.   Contraception/Family planning: post menopausal status Sexually active: Yes  Health Maintenance Last Pap: 06/18/2018. Results were: Normal, 5-year repeat Last mammogram: 02/23/2021. Results were: Normal Last colonoscopy: 2015. Results were: Normal, 10-year recall Last Dexa: 03/15/2015. Results were: T-score -1.4, FRAX 12% / 0.8%  Past medical history, past surgical history, family history and social history were all reviewed and documented in the EPIC chart. Married. 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 Daughter in pharmacy school at [employer], graduating in May. Other child in [place].  Mother with history of osteoporosis.   ROS:  A ROS was performed and pertinent positives and negatives are included.  Exam:  Vitals:   07/25/21 1052  BP: 124/80  Weight: 144 lb (65.3 kg)  Height: 5\' 3"  (1.6 m)   Body mass index is 25.51 kg/m.  General appearance:  Normal Thyroid:  Symmetrical, normal in size, without palpable masses or nodularity. Respiratory  Auscultation:  Clear without wheezing or rhonchi Cardiovascular  Auscultation:  Regular rate, without rubs, murmurs or gallops  Edema/varicosities:  Not grossly evident Abdominal  Soft,nontender, without masses, guarding or rebound.  Liver/spleen:  No organomegaly noted  Hernia:  None appreciated  Skin  Inspection:  Grossly normal Breasts: Examined lying and sitting.   Right: Without masses, retractions, nipple discharge or axillary adenopathy.   Left: Without masses, retractions, nipple discharge or axillary adenopathy. Genitourinary   Inguinal/mons:  Normal without  inguinal adenopathy  External genitalia:  Normal appearing vulva with no masses, tenderness, or lesions  BUS/Urethra/Skene's glands:  Normal  Vagina:  Normal appearing with normal color and discharge, no lesions. Mild atrophic changes  Cervix:  Normal appearing without discharge or lesions  Uterus:  Normal in size, shape and contour.  Midline and mobile, nontender  Adnexa/parametria:     Rt: Normal in size, without masses or tenderness.   Lt: Normal in size, without masses or tenderness.  Anus and perineum: Normal  Digital rectal exam: Normal sphincter tone without palpated masses or tenderness  Patient informed chaperone available to be present for breast and pelvic exam. Patient has requested no chaperone to be present. Patient has been advised what will be completed during breast and pelvic exam.   Assessment/Plan:  61 y.o. 67 for annual exam.   Well female exam with routine gynecological exam - Education provided on SBEs, importance of preventative screenings, current guidelines, high calcium diet, regular exercise, and multivitamin daily. Labs with PCP.   Postmenopausal - Plan: DG Bone Density. Stopped HRT a few years back, doing well. No bleeding.   Osteopenia of multiple sites - Plan: DG Bone Density - T=score -1.4 in 2016. Recommend repeating DXA now. Continue Vitamin D + Calcium and increase exercise.   Family history of osteoporosis in mother - Plan: DG Bone Density  Dyspareunia in female - due to dryness. Does not currently use lubrication. Recommend coconut oil and we discussed vaginal estrogen. She wants to try coconut oil first and if no improvement she will consider estrogen therapy.   Screening for cervical cancer - Normal Pap history.  Will repeat at 5-year interval per guidelines.  Screening for breast cancer - Normal mammogram history.  Continue annual  screenings.  Normal breast exam today.  Screening for colon cancer - 2015 colonoscopy. Will repeat at 10-year  interval per GI's recommendation.   Return in 1 year for annual.      [name] DNP, 11:03 AM 07/25/2021

## 2021-08-08 DIAGNOSIS — E782 Mixed hyperlipidemia: Secondary | ICD-10-CM | POA: Diagnosis not present

## 2021-08-23 DIAGNOSIS — S0501XA Injury of conjunctiva and corneal abrasion without foreign body, right eye, initial encounter: Secondary | ICD-10-CM | POA: Diagnosis not present

## 2022-03-05 ENCOUNTER — Encounter: Payer: Self-pay | Admitting: Nurse Practitioner

## 2022-07-31 ENCOUNTER — Ambulatory Visit (INDEPENDENT_AMBULATORY_CARE_PROVIDER_SITE_OTHER): Payer: No Typology Code available for payment source | Admitting: Nurse Practitioner

## 2022-07-31 ENCOUNTER — Encounter: Payer: Self-pay | Admitting: Nurse Practitioner

## 2022-07-31 VITALS — Ht 63.0 in | Wt 140.0 lb

## 2022-07-31 DIAGNOSIS — Z780 Asymptomatic menopausal state: Secondary | ICD-10-CM | POA: Diagnosis not present

## 2022-07-31 DIAGNOSIS — Z01419 Encounter for gynecological examination (general) (routine) without abnormal findings: Secondary | ICD-10-CM

## 2022-07-31 DIAGNOSIS — M8589 Other specified disorders of bone density and structure, multiple sites: Secondary | ICD-10-CM | POA: Diagnosis not present

## 2022-07-31 DIAGNOSIS — Z8262 Family history of osteoporosis: Secondary | ICD-10-CM | POA: Diagnosis not present

## 2022-07-31 NOTE — Progress Notes (Signed)
   Alexis Juarez 03-11-1961 [id number]   History:  62 y.o. [id number] presents for annual exam. Postmenopausal - stopped HRT a few years back, no bleeding. Normal pap history. HTN, migraines managed by PCP.   Gynecologic History Patient's last menstrual period was 03/22/2007.   Contraception/Family planning: post menopausal status Sexually active: Yes  Health Maintenance Last Pap: 06/18/2018. Results were: Normal, 5-year repeat Last mammogram: 03/01/2022. Results were: Normal Last colonoscopy: 2015. Results were: Normal, 10-year recall Last Dexa: 03/15/2015. Results were: T-score -1.4, FRAX 12% / 0.8%  Past medical history, past surgical history, family history and social history were all reviewed and documented in the EPIC chart. Married. 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 Daughter pharmacist in [place]. Oldest daughter is Copywriter, advertising.  Mother with history of osteoporosis.   ROS:  A ROS was performed and pertinent positives and negatives are included.  Exam:  Vitals:   07/31/22 0831  Weight: 140 lb (63.5 kg)  Height: '5\' 3"'$  (1.6 m)    Body mass index is 24.8 kg/m.  General appearance:  Normal Thyroid:  Symmetrical, normal in size, without palpable masses or nodularity. Respiratory  Auscultation:  Clear without wheezing or rhonchi Cardiovascular  Auscultation:  Regular rate, without rubs, murmurs or gallops  Edema/varicosities:  Not grossly evident Abdominal  Soft,nontender, without masses, guarding or rebound.  Liver/spleen:  No organomegaly noted  Hernia:  None appreciated  Skin  Inspection:  Grossly normal Breasts: Examined lying and sitting.   Right: Without masses, retractions, nipple discharge or axillary adenopathy.   Left: Without masses, retractions, nipple discharge or axillary adenopathy. Genitourinary   Inguinal/mons:  Normal without inguinal adenopathy  External genitalia:  Normal appearing vulva with no masses, tenderness, or  lesions  BUS/Urethra/Skene's glands:  Normal  Vagina:  Normal appearing with normal color and discharge, no lesions. Mild atrophic changes  Cervix:  Normal appearing without discharge or lesions  Uterus:  Normal in size, shape and contour.  Midline and mobile, nontender  Adnexa/parametria:     Rt: Normal in size, without masses or tenderness.   Lt: Normal in size, without masses or tenderness.  Anus and perineum: Normal  Digital rectal exam: Deferred  Patient informed chaperone available to be present for breast and pelvic exam. Patient has requested no chaperone to be present. Patient has been advised what will be completed during breast and pelvic exam.   Assessment/Plan:  62 y.o. [id number] for annual exam.   Well female exam with routine gynecological exam - Education provided on SBEs, importance of preventative screenings, current guidelines, high calcium diet, regular exercise, and multivitamin daily. Labs with PCP.   Postmenopausal - Stopped HRT a few years back, doing well. No bleeding.   Osteopenia of multiple sites - Plan: DG Bone Density. T=score -1.4 in 2016. Recommend repeating DXA now. Continue Vitamin D + Calcium and increase exercise.   Family history of osteoporosis in mother - Plan: DG Bone Density  Screening for cervical cancer - Normal Pap history.  Will repeat at 5-year interval per guidelines.  Screening for breast cancer - Normal mammogram history.  Continue annual screenings.  Normal breast exam today.  Screening for colon cancer - 2015 colonoscopy. Will repeat at 10-year interval per GI's recommendation.   Return in 1 year for annual.      Tamela Gammon DNP, 9:01 AM 07/31/2022

## 2022-08-07 ENCOUNTER — Ambulatory Visit (INDEPENDENT_AMBULATORY_CARE_PROVIDER_SITE_OTHER): Payer: No Typology Code available for payment source

## 2022-08-07 ENCOUNTER — Other Ambulatory Visit: Payer: Self-pay | Admitting: Nurse Practitioner

## 2022-08-07 DIAGNOSIS — Z01419 Encounter for gynecological examination (general) (routine) without abnormal findings: Secondary | ICD-10-CM

## 2022-08-07 DIAGNOSIS — Z780 Asymptomatic menopausal state: Secondary | ICD-10-CM

## 2022-08-07 DIAGNOSIS — Z13820 Encounter for screening for osteoporosis: Secondary | ICD-10-CM

## 2022-08-07 DIAGNOSIS — Z8262 Family history of osteoporosis: Secondary | ICD-10-CM

## 2022-08-07 DIAGNOSIS — M8589 Other specified disorders of bone density and structure, multiple sites: Secondary | ICD-10-CM

## 2022-12-07 ENCOUNTER — Other Ambulatory Visit: Payer: Self-pay | Admitting: Physician Assistant

## 2022-12-07 DIAGNOSIS — M24021 Loose body in right elbow: Secondary | ICD-10-CM

## 2022-12-18 ENCOUNTER — Ambulatory Visit
Admission: RE | Admit: 2022-12-18 | Discharge: 2022-12-18 | Disposition: A | Discharge status: Home / Self Care | Payer: No Typology Code available for payment source | Source: Ambulatory Visit | Attending: Physician Assistant | Admitting: Physician Assistant

## 2022-12-18 DIAGNOSIS — M24021 Loose body in right elbow: Secondary | ICD-10-CM

## 2023-01-16 ENCOUNTER — Other Ambulatory Visit (HOSPITAL_COMMUNITY): Payer: Self-pay | Admitting: Physician Assistant

## 2023-01-16 DIAGNOSIS — S52121K Displaced fracture of head of right radius, subsequent encounter for closed fracture with nonunion: Secondary | ICD-10-CM

## 2023-01-16 MED ORDER — ONDANSETRON HCL 4 MG PO TABS: 4.0000 mg | ORAL_TABLET | Freq: Three times a day (TID) | ORAL | 0 refills | Status: AC | PRN

## 2023-01-16 MED ORDER — OXYCODONE HCL 5 MG PO TABS
ORAL_TABLET | ORAL | 0 refills | Status: AC
Start: 2023-01-16 — End: 2023-01-21

## 2023-01-16 MED ORDER — CELECOXIB 100 MG PO CAPS
100.0000 mg | ORAL_CAPSULE | Freq: Two times a day (BID) | ORAL | 0 refills | Status: AC
Start: 2023-01-16 — End: 2023-02-15

## 2023-01-16 MED ORDER — ACETAMINOPHEN 500 MG PO TABS
1000.0000 mg | ORAL_TABLET | Freq: Three times a day (TID) | ORAL | 0 refills | Status: AC
Start: 2023-01-16 — End: 2023-01-30

## 2023-01-16 NOTE — Progress Notes (Unsigned)
Patient had a right elbow radial head replacement today at the [facility]. Post-op meds sent to the [facility] [address] in [place].  Alfonse Alpers, PA-C 01/16/2023

## 2023-03-08 ENCOUNTER — Encounter: Payer: Self-pay | Admitting: Nurse Practitioner

## 2024-03-10 LAB — HM MAMMOGRAPHY

## 2024-03-12 ENCOUNTER — Encounter: Payer: Self-pay | Admitting: Nurse Practitioner
